# Patient Record
Sex: Female | Born: 1965 | Race: White | Hispanic: No | Marital: Married | Smoking: Never smoker
Health system: Southern US, Community
[De-identification: ages and names within clinical notes are randomized; demographics above are authoritative.]

## PROBLEM LIST (undated history)

## (undated) DIAGNOSIS — F329 Major depressive disorder, single episode, unspecified: Secondary | ICD-10-CM

## (undated) DIAGNOSIS — T753XXA Motion sickness, initial encounter: Secondary | ICD-10-CM

## (undated) DIAGNOSIS — J449 Chronic obstructive pulmonary disease, unspecified: Secondary | ICD-10-CM

## (undated) DIAGNOSIS — K219 Gastro-esophageal reflux disease without esophagitis: Secondary | ICD-10-CM

## (undated) DIAGNOSIS — F419 Anxiety disorder, unspecified: Secondary | ICD-10-CM

## (undated) DIAGNOSIS — N951 Menopausal and female climacteric states: Secondary | ICD-10-CM

## (undated) DIAGNOSIS — G039 Meningitis, unspecified: Secondary | ICD-10-CM

## (undated) DIAGNOSIS — J309 Allergic rhinitis, unspecified: Secondary | ICD-10-CM

## (undated) DIAGNOSIS — F32A Depression, unspecified: Secondary | ICD-10-CM

## (undated) DIAGNOSIS — Z780 Asymptomatic menopausal state: Secondary | ICD-10-CM

## (undated) DIAGNOSIS — R000 Tachycardia, unspecified: Secondary | ICD-10-CM

## (undated) DIAGNOSIS — J45909 Unspecified asthma, uncomplicated: Secondary | ICD-10-CM

## (undated) DIAGNOSIS — I340 Nonrheumatic mitral (valve) insufficiency: Secondary | ICD-10-CM

## (undated) DIAGNOSIS — Z9889 Other specified postprocedural states: Secondary | ICD-10-CM

## (undated) DIAGNOSIS — M8580 Other specified disorders of bone density and structure, unspecified site: Secondary | ICD-10-CM

## (undated) DIAGNOSIS — R5383 Other fatigue: Secondary | ICD-10-CM

## (undated) DIAGNOSIS — E039 Hypothyroidism, unspecified: Secondary | ICD-10-CM

## (undated) DIAGNOSIS — M5030 Other cervical disc degeneration, unspecified cervical region: Secondary | ICD-10-CM

## (undated) DIAGNOSIS — R42 Dizziness and giddiness: Secondary | ICD-10-CM

## (undated) DIAGNOSIS — R011 Cardiac murmur, unspecified: Secondary | ICD-10-CM

## (undated) DIAGNOSIS — J189 Pneumonia, unspecified organism: Secondary | ICD-10-CM

## (undated) DIAGNOSIS — R112 Nausea with vomiting, unspecified: Secondary | ICD-10-CM

## (undated) HISTORY — PX: ABDOMINAL HYSTERECTOMY: SHX81

## (undated) HISTORY — DX: Chronic obstructive pulmonary disease, unspecified: J44.9

## (undated) HISTORY — DX: Unspecified asthma, uncomplicated: J45.909

## (undated) HISTORY — DX: Anxiety disorder, unspecified: F41.9

## (undated) HISTORY — DX: Allergic rhinitis, unspecified: J30.9

## (undated) HISTORY — PX: SHOULDER SURGERY: SHX246

## (undated) HISTORY — PX: LEFT OOPHORECTOMY: SHX1961

## (undated) HISTORY — DX: Cardiac murmur, unspecified: R01.1

## (undated) HISTORY — DX: Menopausal and female climacteric states: N95.1

## (undated) HISTORY — DX: Other fatigue: R53.83

## (undated) HISTORY — PX: LAPAROSCOPY: SHX197

## (undated) HISTORY — DX: Major depressive disorder, single episode, unspecified: F32.9

## (undated) HISTORY — DX: Gastro-esophageal reflux disease without esophagitis: K21.9

## (undated) HISTORY — PX: CYSTECTOMY: SUR359

## (undated) HISTORY — DX: Other specified disorders of bone density and structure, unspecified site: M85.80

## (undated) HISTORY — PX: ARTHROSCOPIC REPAIR ACL: SUR80

## (undated) HISTORY — DX: Depression, unspecified: F32.A

## (undated) HISTORY — PX: KNEE SURGERY: SHX244

## (undated) HISTORY — DX: Other cervical disc degeneration, unspecified cervical region: M50.30

## (undated) HISTORY — DX: Meningitis, unspecified: G03.9

## (undated) HISTORY — DX: Tachycardia, unspecified: R00.0

## (undated) HISTORY — DX: Nonrheumatic mitral (valve) insufficiency: I34.0

## (undated) HISTORY — PX: DILATION AND CURETTAGE OF UTERUS: SHX78

## (undated) HISTORY — DX: Asymptomatic menopausal state: Z78.0

## (undated) HISTORY — DX: Pneumonia, unspecified organism: J18.9

---

## 2005-02-25 ENCOUNTER — Ambulatory Visit: Payer: Self-pay | Admitting: Orthopaedic Surgery

## 2005-03-29 ENCOUNTER — Ambulatory Visit: Payer: Self-pay | Admitting: Orthopaedic Surgery

## 2009-08-14 ENCOUNTER — Ambulatory Visit: Payer: Self-pay | Admitting: Obstetrics and Gynecology

## 2010-12-23 ENCOUNTER — Ambulatory Visit: Payer: Self-pay | Admitting: Obstetrics and Gynecology

## 2011-09-11 ENCOUNTER — Ambulatory Visit: Payer: Self-pay | Admitting: Internal Medicine

## 2011-09-15 ENCOUNTER — Ambulatory Visit: Payer: Self-pay | Admitting: Internal Medicine

## 2011-09-24 ENCOUNTER — Ambulatory Visit: Payer: Self-pay | Admitting: Internal Medicine

## 2012-01-13 ENCOUNTER — Ambulatory Visit: Payer: Self-pay | Admitting: Obstetrics and Gynecology

## 2013-01-11 LAB — HM PAP SMEAR: HM PAP: NEGATIVE

## 2013-03-26 ENCOUNTER — Ambulatory Visit: Payer: Self-pay | Admitting: Obstetrics and Gynecology

## 2013-08-24 ENCOUNTER — Encounter: Payer: Self-pay | Admitting: Internal Medicine

## 2013-08-24 LAB — PULMONARY FUNCTION TEST

## 2014-07-05 ENCOUNTER — Encounter: Payer: Self-pay | Admitting: Internal Medicine

## 2014-09-23 DIAGNOSIS — F329 Major depressive disorder, single episode, unspecified: Secondary | ICD-10-CM | POA: Insufficient documentation

## 2014-09-23 DIAGNOSIS — F419 Anxiety disorder, unspecified: Secondary | ICD-10-CM | POA: Insufficient documentation

## 2014-09-23 DIAGNOSIS — F32A Depression, unspecified: Secondary | ICD-10-CM | POA: Insufficient documentation

## 2014-09-23 DIAGNOSIS — E7800 Pure hypercholesterolemia, unspecified: Secondary | ICD-10-CM | POA: Insufficient documentation

## 2015-02-20 ENCOUNTER — Ambulatory Visit
Admit: 2015-02-20 | Disposition: A | Discharge status: Home / Self Care | Payer: Self-pay | Attending: Gastroenterology | Admitting: Gastroenterology

## 2015-12-18 ENCOUNTER — Encounter: Payer: Self-pay | Admitting: Internal Medicine

## 2015-12-18 LAB — PULMONARY FUNCTION TEST

## 2015-12-19 ENCOUNTER — Ambulatory Visit (INDEPENDENT_AMBULATORY_CARE_PROVIDER_SITE_OTHER): Payer: BLUE CROSS/BLUE SHIELD | Admitting: Cardiovascular Disease

## 2015-12-19 ENCOUNTER — Encounter: Payer: Self-pay | Admitting: Cardiovascular Disease

## 2015-12-19 VITALS — BP 120/78 | HR 86 | Ht 61.5 in | Wt 146.8 lb

## 2015-12-19 DIAGNOSIS — R0602 Shortness of breath: Secondary | ICD-10-CM | POA: Insufficient documentation

## 2015-12-19 DIAGNOSIS — R000 Tachycardia, unspecified: Secondary | ICD-10-CM

## 2015-12-19 DIAGNOSIS — R079 Chest pain, unspecified: Secondary | ICD-10-CM

## 2015-12-19 DIAGNOSIS — J4540 Moderate persistent asthma, uncomplicated: Secondary | ICD-10-CM

## 2015-12-19 DIAGNOSIS — J069 Acute upper respiratory infection, unspecified: Secondary | ICD-10-CM

## 2015-12-19 DIAGNOSIS — B9789 Other viral agents as the cause of diseases classified elsewhere: Secondary | ICD-10-CM

## 2015-12-19 MED ORDER — LEVALBUTEROL TARTRATE 45 MCG/ACT IN AERO: 1.0000 | INHALATION_SPRAY | RESPIRATORY_TRACT | Status: DC | PRN

## 2015-12-19 MED ORDER — DILTIAZEM HCL 30 MG PO TABS: 30.0000 mg | ORAL_TABLET | Freq: Three times a day (TID) | ORAL | Status: DC | PRN

## 2015-12-19 NOTE — Patient Instructions (Addendum)
You are doing well.  Xopenex as needed for rescue inhaler  Echocardiogram for shortness of breath, chest pain  Echocardiography is a painless test that uses sound waves to create images of your heart. It provides your doctor with information about the size and shape of your heart and how well your heart's chambers and valves are working. This procedure takes approximately one hour. There are no restrictions for this procedure.  Date & time: __________________________________  Monitor heart rate If you convert quickly to a tachycardia, Take diltiazem 30 mg 1 to 2 pills as needed  Please call us if you have new issues that need to be addressed before your next appt.     Echocardiogram An echocardiogram, or echocardiography, uses sound waves (ultrasound) to produce an image of your heart. The echocardiogram is simple, painless, obtained within a short period of time, and offers valuable information to your health care provider. The images from an echocardiogram can provide information such as:  Evidence of coronary artery disease (CAD).  Heart size.  Heart muscle function.  Heart valve function.  Aneurysm detection.  Evidence of a past heart attack.  Fluid buildup around the heart.  Heart muscle thickening.  Assess heart valve function. LET [facility] CARE PROVIDER KNOW ABOUT:  Any allergies you have.  All medicines you are taking, including vitamins, herbs, eye drops, creams, and over-the-counter medicines.  Previous problems you or members of your family have had with the use of anesthetics.  Any blood disorders you have.  Previous surgeries you have had.  Medical conditions you have.  Possibility of pregnancy, if this applies. BEFORE THE PROCEDURE  No special preparation is needed. Eat and drink normally.  PROCEDURE   In order to produce an image of your heart, gel will be applied to your chest and a wand-like tool (transducer) will be moved over your chest.  The gel will help transmit the sound waves from the transducer. The sound waves will harmlessly bounce off your heart to allow the heart images to be captured in real-time motion. These images will then be recorded.  You may need an IV to receive a medicine that improves the quality of the pictures. AFTER THE PROCEDURE You may return to your normal schedule including diet, activities, and medicines, unless your health care provider tells you otherwise.   This information is not intended to replace advice given to you by your health care provider. Make sure you discuss any questions you have with your health care provider.   Document Released: 10/29/2000 Document Revised: 11/22/2014 Document Reviewed: 07/09/2013 Elsevier Interactive Patient Education [employer].

## 2015-12-19 NOTE — Progress Notes (Addendum)
Patient ID: Cheyenne Nash, female    DOB: [birth year], 50 y.o.   MRN: [id number]  HPI Comments:  Cheyenne Nash is a pleasant 50 year old woman with history of asthma , followed by pulmonology in [place] , Dr [place], who presents by self-referral for symptoms of shortness of breath, tachycardia.  she reports that she does not have primary care physician at this time   She reports that in December she developed upper respiratory infection,  She had sore throat, cough , Gen. Malaise  She  conferenced with a [place]  And was given  Prednisone taper, Tessalon Perles.   She reports that symptoms persisted 5 days later, Tessalon Perles did not help her cough , she was " no better"  Again she continued to have coughing, shortness of breath, sore throat, reported that her chest hurt from the coughing.  She was given prednisone and Z-Pak with cough syrup with codeine.  She reports this helped her  Symptoms though cough recurred once or prednisone taper resolved.   She reports having had 4 prednisone tapers through this period.  Cough seems to improve on prednisone, but quickly recurs  After taper.   Currently feels somewhat better today but recently having significant tachycardia, lightheadedness, shortness of breath  [place] in [employer].  Went home , continued to have shortness of breath, tachycardia.  Reports it took some time for her symptoms to resolve.   Prior to  December 2016, she was well with no prior cardiac issues.  She denies any weight gain, abdominal swelling, lower extremity edema.  She has difficulty taking a deep breath secondary to cough.  Difficulty lying flat secondary to cough.  She has been monitoring her heart rate with a pulse oximeter,  Reports having heart rate up to 120 bpm on the day when she did appreciate tachycardia.  Unclear if she had a slow steady increase in her heart rate or if tachycardia was of acute onset.   EKG on today's visit shows no sinus rhythm  with rate 86 bpm, no significant ST or T-wave changes   Allergies  Allergen Reactions  . Ivp Dye [Iodinated Diagnostic Agents]   . Sulfa Antibiotics     Hives  Swelling       Medication List       This list is accurate as of: 12/19/15  5:54 PM.  Always use your most recent med list.               ALPRAZOLAM XR 1 MG 24 hr tablet  Generic drug:  ALPRAZolam  Take 1 mg by mouth daily.     azelastine 0.1 % nasal spray  Commonly known as:  ASTELIN  Place 2 sprays into the nose 2 (two) times daily.     azithromycin 500 MG tablet  Commonly known as:  ZITHROMAX  Take 500 mg by mouth. Monday, Wednesday and Friday.     cetirizine 10 MG tablet  Commonly known as:  ZYRTEC  Take 10 mg by mouth daily.     dexamethasone 4 MG tablet  Commonly known as:  DECADRON  4 mg. Take as directed.        doxycycline 100 MG capsule  Commonly known as:  VIBRAMYCIN  Take 100 mg by mouth daily.     DULoxetine 30 MG capsule  Commonly known as:  CYMBALTA  1 capsule qd for 2 weeks, then increase to 2 capsules once a day.     estradiol 1 MG  tablet  Commonly known as:  ESTRACE  TK 1 T PO D     fluticasone 50 MCG/ACT nasal spray  Commonly known as:  FLONASE  Place 2 sprays into the nose daily.        montelukast 10 MG tablet  Commonly known as:  SINGULAIR  Take 10 mg by mouth at bedtime.     omeprazole 40 MG capsule  Commonly known as:  PRILOSEC  Take 40 mg by mouth daily.     predniSONE 5 MG tablet  Commonly known as:  DELTASONE  6 po day one decrease by 5mg  qd til gone.     traZODone 100 MG tablet  Commonly known as:  DESYREL  TAKE 1 TABLET BY MOUTH EVERY NIGHT     valACYclovir 500 MG tablet  Commonly known as:  VALTREX  Take 500 mg by mouth daily as needed.     VENTOLIN HFA 108 (90 Base) MCG/ACT inhaler  Generic drug:  albuterol  Inhale 1-2 puffs into the lungs every 4 (four) hours as needed.     VOLTAREN 1 % Gel  Generic drug:  diclofenac sodium  Apply 2 g topically  as needed.         Past Medical History  Diagnosis Date  . GERD (gastroesophageal reflux disease)   . Degenerative disc disease, cervical   . Allergic rhinitis   . Anxiety and depression   . Hypercholesterolemia   . Osteopenia   . Menopausal syndrome   . COPD (chronic obstructive pulmonary disease) ([place])   . Asthma   . Heart murmur   . Tachycardia   . Mitral regurgitation     Past Surgical History  Procedure Laterality Date  . Abdominal hysterectomy    . Shoulder surgery      RIGHT  . Knee surgery      LEFT   . Arthroscopic repair acl      LEFT    Social History  reports that she has never smoked. She does not have any smokeless tobacco history on file. She reports that she does not drink alcohol or use illicit drugs.  Family History family history includes Heart attack in her mother; Heart disease (age of onset: 68) in her father; Hypertension in her father and mother.   Review of Systems  Constitutional: Negative.   Respiratory: Positive for cough and chest tightness.   Cardiovascular: Positive for palpitations.        tachycardia  Gastrointestinal: Negative.   Musculoskeletal: Negative.   Neurological: Negative.   Hematological: Negative.   Psychiatric/Behavioral: Negative.   All other systems reviewed and are negative.   BP 120/78 mmHg  Pulse 86  Ht 5' 1.5" (1.562 m)  Wt 146 lb 12 oz (66.565 kg)  BMI 27.28 kg/m2  Physical Exam  Constitutional: She is oriented to person, place, and time. She appears well-developed and well-nourished.  HENT:  Head: Normocephalic.  Nose: Nose normal.  Mouth/Throat: Oropharynx is clear and moist.  Eyes: Conjunctivae are normal. Pupils are equal, round, and reactive to light.  Neck: Normal range of motion. Neck supple. No JVD present.  Cardiovascular: Normal rate, regular rhythm, normal heart sounds and intact distal pulses.  Exam reveals no gallop and no friction rub.   No murmur heard. Pulmonary/Chest: Effort  normal. No respiratory distress. She has decreased breath sounds in the right lower field and the left lower field. She has no wheezes. She has no rales. She exhibits no tenderness.   Patient coughing with deep  inspiration  Abdominal: Soft. Bowel sounds are normal. She exhibits no distension. There is no tenderness.  Musculoskeletal: Normal range of motion. She exhibits no edema or tenderness.  Lymphadenopathy:    She has no cervical adenopathy.  Neurological: She is alert and oriented to person, place, and time. Coordination normal.  Skin: Skin is warm and dry. No rash noted. No erythema.  Psychiatric: She has a normal mood and affect. Her behavior is normal. Judgment and thought content normal.

## 2015-12-19 NOTE — Assessment & Plan Note (Signed)
No significant wheezing on today's visit  She has had several courses of prednisone  Symptoms suggestive of resolving viral URI, bronchospastic cough

## 2015-12-19 NOTE — Assessment & Plan Note (Signed)
Etiology of her shortness of breath likely multifactorial including resolving viral URI, asthma.  Unable to exclude structural heart disease, elevated right heart pressures, pleural effusion.  Echocardiogram pending, followed by a CT scan of the chest  To avoid tachycardia, suggested she try Xopenex

## 2015-12-19 NOTE — Assessment & Plan Note (Signed)
Atypical type chest pain coming on at nighttime when she lays down, sounds more pleuritic , even musculoskeletal.  May be exacerbated by coughing. Less likely pulmonary embolism given the chronic  insidious nature of her symptoms,  Associated with the viral URI symptoms  we will call her with the results of her echocardiogram  CT scan pending for 3 weeks from now back with pulmonology   Total encounter time more than 45 minutes  Greater than 50% was spent in counseling and coordination of care with the patient

## 2015-12-19 NOTE — Assessment & Plan Note (Signed)
Symptoms suggestive of sinus tachycardia brought on by exertion  Likely exacerbated by underlying lung disease, bronchospasm, asthma, resolving viral URI  Unable to exclude atrial tachycardia  As heart rate was around 120 bpm.  Recommended she monitor her heart rate using the pulse oximeter with exertion.  If she has acute onset of a tachycardia, suggested she try low-dose diltiazem 30 mg pill with repeat if needed.  Ideally would use short acting beta blocker such as propranolol but we will avoid this in the setting of asthma, possible bronchospasm.  We have offered today or even 30 day monitor if she has recurrent symptoms

## 2015-12-19 NOTE — Assessment & Plan Note (Signed)
Patient had sore throat,  Laryngitis,general malaise, cough at the start of her illness.  No real response to antibiotics  seems to have improved with prednisone, still with residual cough  Echocardiogram has been ordered to rule out structural heart disease, estimate right heart pressures.  Unable to exclude pleural effusions as she is unable to take a deep breath.  She reports CT scan has been scheduled for 3 weeks from now with pulmonology in [place].

## 2015-12-25 ENCOUNTER — Other Ambulatory Visit: Payer: Self-pay

## 2015-12-25 ENCOUNTER — Telehealth: Payer: Self-pay | Admitting: Cardiovascular Disease

## 2015-12-25 ENCOUNTER — Ambulatory Visit
Admission: RE | Admit: 2015-12-25 | Discharge: 2015-12-25 | Disposition: A | Discharge status: Home / Self Care | Payer: BLUE CROSS/BLUE SHIELD | Source: Ambulatory Visit | Attending: Cardiovascular Disease | Admitting: Cardiovascular Disease

## 2015-12-25 ENCOUNTER — Ambulatory Visit (INDEPENDENT_AMBULATORY_CARE_PROVIDER_SITE_OTHER): Payer: BLUE CROSS/BLUE SHIELD

## 2015-12-25 DIAGNOSIS — R079 Chest pain, unspecified: Secondary | ICD-10-CM | POA: Diagnosis not present

## 2015-12-25 DIAGNOSIS — R0602 Shortness of breath: Secondary | ICD-10-CM | POA: Insufficient documentation

## 2015-12-25 NOTE — Telephone Encounter (Signed)
Pt in office this am for ECHO, c/o SOB. Per Dr. Rockey Situ, recommended pt proceed w/ CT that was suggested by her pulmonologist in [place]. She states that she was advised against proceeding w/ this at her last ov, as "the pictures wouldn't be very good." Advised her that Dr. Rockey Situ would like to get a chest x-ray on her today before adjusting meds, as pt's sx are most likely r/t her lungs. Offered to schedule her w/ pulm here, but she declines "until this infection is under control". Dr. Rockey Situ is in clinic and has not read ECHO yet. She states that Dr. Delilah Shan in [place] told her that she "is going downhill fast" and he suggested she be out of work until March 1. She asks if we can provide a letter clearing her to be out of work, advised her to contact Dr. Delilah Shan for this.  Pt proceeded to the [facility] for cxr, but came back and stated that she "just had one". We do not have any x-rays in [facility] or [facility] (last one I can see was in 2005). Dr. Rockey Situ is in clinic w/ a new pt at the moment, will await his recommendation.

## 2015-12-25 NOTE — Telephone Encounter (Signed)
Spoke w/ pt.  She states that she went ahead and had the chest x-ray before hearing back from [place]. Advised her that Dr. Rockey Situ reviewed her preliminary ECHO results and it is normal.   He recommends that she proceed w/ chest CT that was ordered by Dr. Delilah Shan and that she should est care w/ pulmonary here so that she will have one here in [place], as Dr. Delilah Shan is treating her from [place]. She became quite upset and stated that Dr. Rockey Situ told her that she did not need to leave Dr. Delilah Shan and now we are telling her otherwise.  Advised her that she does not need to leave him, but her prelim ECHO results do not indicate that her sx are heart related, so it would benefit her to have someone here to communicate w/ pulmonologist.  She states"it's up to Dr. Rockey Situ, but he never told me who he wanted me to see".   She states that she has already left the premises and does not want to come back over to make appt.  Advised her that I can transfer her to scheduling to set this up, she does not need to be present. She raised her voice several times during conversation, stating that I am giving her conflicting information. She became frustrated and handed the phone to her son in law.   Advised him of Dr. Donivan Scull recommendation and he is agreeable to scheduling appt w/ pulmonary.  Call transferred to front desk to set this up.

## 2015-12-25 NOTE — Telephone Encounter (Signed)
Patient is waiting [facility] in [facility] on reply to needing another xray today.  [place] waiting on Dr. Donivan Scull response.  Patient says she just had a xray  Ordered by [place] last week.  Please call patient when with response.

## 2015-12-26 ENCOUNTER — Telehealth: Payer: Self-pay | Admitting: *Deleted

## 2015-12-26 NOTE — Telephone Encounter (Signed)
Results routed to Dr. Delilah Shan.

## 2015-12-26 NOTE — Telephone Encounter (Signed)
Patient called and wants results of echo and chest xray sent to Dr. Delilah Shan with [facility] tele# [phone] or [phone] sent stat! Thanks

## 2015-12-30 ENCOUNTER — Ambulatory Visit: Payer: Self-pay | Admitting: Cardiovascular Disease

## 2015-12-31 ENCOUNTER — Institutional Professional Consult (permissible substitution): Payer: Self-pay | Admitting: Internal Medicine

## 2016-01-09 ENCOUNTER — Encounter: Payer: Self-pay | Admitting: Internal Medicine

## 2016-01-15 ENCOUNTER — Ambulatory Visit (INDEPENDENT_AMBULATORY_CARE_PROVIDER_SITE_OTHER): Payer: BLUE CROSS/BLUE SHIELD | Admitting: Obstetrics and Gynecology

## 2016-01-15 ENCOUNTER — Encounter: Payer: Self-pay | Admitting: Obstetrics and Gynecology

## 2016-01-15 VITALS — BP 115/74 | HR 79 | Temp 98.4°F | Resp 16 | Ht 62.0 in | Wt 144.0 lb

## 2016-01-15 DIAGNOSIS — J45901 Unspecified asthma with (acute) exacerbation: Secondary | ICD-10-CM

## 2016-01-15 DIAGNOSIS — N649 Disorder of breast, unspecified: Secondary | ICD-10-CM

## 2016-01-19 ENCOUNTER — Institutional Professional Consult (permissible substitution): Payer: BLUE CROSS/BLUE SHIELD | Admitting: Pulmonary Disease

## 2016-01-19 DIAGNOSIS — R51 Headache: Secondary | ICD-10-CM

## 2016-01-19 DIAGNOSIS — J309 Allergic rhinitis, unspecified: Secondary | ICD-10-CM | POA: Insufficient documentation

## 2016-01-19 DIAGNOSIS — N951 Menopausal and female climacteric states: Secondary | ICD-10-CM | POA: Insufficient documentation

## 2016-01-19 DIAGNOSIS — K219 Gastro-esophageal reflux disease without esophagitis: Secondary | ICD-10-CM | POA: Insufficient documentation

## 2016-01-19 DIAGNOSIS — J45909 Unspecified asthma, uncomplicated: Secondary | ICD-10-CM | POA: Insufficient documentation

## 2016-01-19 DIAGNOSIS — M8580 Other specified disorders of bone density and structure, unspecified site: Secondary | ICD-10-CM | POA: Insufficient documentation

## 2016-01-19 DIAGNOSIS — R519 Headache, unspecified: Secondary | ICD-10-CM | POA: Insufficient documentation

## 2016-01-19 DIAGNOSIS — M5030 Other cervical disc degeneration, unspecified cervical region: Secondary | ICD-10-CM | POA: Insufficient documentation

## 2016-01-19 NOTE — Patient Instructions (Signed)
1.  Follow up with Dr. Doy Hutching regarding internal medicine follow-up. 2.  Pulmonary consultation with [place]. 3.  Obtain MRI results of breast lesion.  Once obtained, we will follow-up with further workup. 4.  Return as scheduled for routine physical

## 2016-01-19 NOTE — Progress Notes (Signed)
Chief complaint: 1.  Breast lesion on MRI. 2.  Pulmonary issues.  The patient presents for follow-up on above issues.  She had seen her pulmonologist in [facility] for acute exacerbation of chronic bronchitis/pulmonary issues.  Previously she had discontinuation of medications which were prescribed for chronic management of her airway disease.  She had a flare of symptomatology, and did not improved with local treatments.  Subsequent, she returned to her pulmonologist who reinstituted.  Medications and improved her pulmonary function.  Part of her work up included a chest MRI that revealed a breast lesion which needs follow-up.  The reports or scans are unavailable at this time.  Past medical history, past surgical history, problems, medications, and allergies are reviewed.  OBJECTIVE: BP 115/74 mmHg  Pulse 79  Temp(Src) 98.4 F (36.9 C) (Oral)  Resp 16  Ht 5\' 2"  (1.575 m)  Wt 144 lb (65.318 kg)  BMI 26.33 kg/m2 Physical exam-deferred.  ASSESSMENT: 1. Reactive airway disease, recent decompensation, now corrected. 2.  MRI breast lesion.  PLAN: 1.  Recommend patient to continue with follow-up with Dr. Henriette Combs, internal medicine. 2.  Referral to [facility] pulmonology for management of chronic reactive airway disease. 3.  Obtain MRI results.  We will proceed after review with follow-up management as indicated by her CT scan findings.  A total of 15 minutes were spent face-to-face with the patient during this encounter and over half of that time dealt with counseling and coordination of care.  Brayton Mars, MD  Note: This dictation was prepared with Dragon dictation along with smaller phrase technology. Any transcriptional errors that result from this process are unintentional.

## 2016-01-20 ENCOUNTER — Telehealth: Payer: Self-pay | Admitting: Pulmonary Disease

## 2016-01-20 NOTE — Telephone Encounter (Signed)
Called and spoke with the pt. Pt is requesting a consult with [facility]. Pt is scheduled for an ov with BQ on 03/05/16. Pt voiced understanding and had no further questions.

## 2016-01-27 ENCOUNTER — Encounter: Payer: Self-pay | Admitting: Obstetrics and Gynecology

## 2016-01-27 ENCOUNTER — Ambulatory Visit (INDEPENDENT_AMBULATORY_CARE_PROVIDER_SITE_OTHER): Payer: BLUE CROSS/BLUE SHIELD | Admitting: Obstetrics and Gynecology

## 2016-01-27 VITALS — BP 90/68 | HR 102 | Ht 62.0 in | Wt 146.3 lb

## 2016-01-27 DIAGNOSIS — J45901 Unspecified asthma with (acute) exacerbation: Secondary | ICD-10-CM | POA: Diagnosis not present

## 2016-01-27 DIAGNOSIS — Z780 Asymptomatic menopausal state: Secondary | ICD-10-CM

## 2016-01-27 DIAGNOSIS — Z0000 Encounter for general adult medical examination without abnormal findings: Secondary | ICD-10-CM | POA: Diagnosis not present

## 2016-01-27 DIAGNOSIS — Z01419 Encounter for gynecological examination (general) (routine) without abnormal findings: Secondary | ICD-10-CM

## 2016-01-27 DIAGNOSIS — Z1239 Encounter for other screening for malignant neoplasm of breast: Secondary | ICD-10-CM

## 2016-01-27 MED ORDER — ESTRADIOL 1 MG PO TABS: 1.0000 mg | ORAL_TABLET | Freq: Every day | ORAL | Status: DC

## 2016-01-27 NOTE — Patient Instructions (Addendum)
1. No Pap smear Needed. 2.  Mammogram 3.  Follow-up after mammogram for review of CT scan and referral to general surgery 4.  Return to Dr. Doy Hutching for immediate management regarding uncontrolled chronic reactive airway disease 5.  Estradiol refilled 6.  Return in 1 year

## 2016-01-27 NOTE — Progress Notes (Signed)
Patient ID: Cheyenne Nash, female   DOB: 11/27/1965, 50 y.o.   MRN: [id number] ANNUAL PREVENTATIVE CARE GYN  ENCOUNTER NOTE  Subjective:       Cheyenne Nash is a 50 y.o. [id number] female here for a routine annual gynecologic exam.  Current complaints: 1. Left breast spot seen on CT 12/2015 2. Exacerbation of reactive airway disease: Scheduled with [facility] pulmonology 03/05/16   Gynecologic History No LMP recorded. Patient has had a hysterectomy. Contraception: status post hysterectomy Last Pap: no further paps needed. Results were: normal Last mammogram: 2014. Results were: normal Last colonoscopy: 08/2015. Results were: normal Menopause onset at 47 HRT: estradiol 1mg  daily for 1 year. No vasomotor symptoms No urinary incontinence or bowel symptoms     Obstetric History OB History  Gravida Para Term Preterm AB SAB TAB Ectopic Multiple Living  2 2 2       2     # Outcome Date GA Lbr Len/2nd Weight Sex Delivery Anes PTL Lv  2 Term    4 lb 2.4 oz (1.882 kg) F Vag-Spont   Y  1 Term    5 lb 2.2 oz (2.331 kg) F Vag-Spont   Y      Past Medical History  Diagnosis Date  . GERD (gastroesophageal reflux disease)   . Degenerative disc disease, cervical   . Allergic rhinitis   . Anxiety and depression   . Hypercholesterolemia   . Osteopenia   . Menopausal syndrome   . COPD (chronic obstructive pulmonary disease) ([place])   . Asthma   . Heart murmur   . Tachycardia   . Mitral regurgitation   . Meningitis     h/o recd some cns and chemo infusion  . Menopause   . Fatigue   . Lung infection     Past Surgical History  Procedure Laterality Date  . Shoulder surgery      RIGHT  . Knee surgery      LEFT   . Arthroscopic repair acl      LEFT  . Abdominal hysterectomy      [place] lso  . Left oophorectomy    . Laparoscopy      lysis of adhesion  . Dilation and curettage of uterus    . Cystectomy Right     Current Outpatient Prescriptions on File Prior to Visit  Medication Sig  Dispense Refill  . ALPRAZolam (ALPRAZOLAM XR) 1 MG 24 hr tablet Take 1 mg by mouth daily.     Marland Kitchen azelastine (ASTELIN) 0.1 % nasal spray Place 2 sprays into the nose 2 (two) times daily.     Marland Kitchen BREO ELLIPTA 100-25 MCG/INH AEPB   10  . cetirizine (ZYRTEC) 10 MG tablet Take 10 mg by mouth daily.     Marland Kitchen dexamethasone (DECADRON) 4 MG tablet 4 mg. Take as directed.  0  . DULoxetine (CYMBALTA) 30 MG capsule 1 capsule qd for 2 weeks, then increase to 2 capsules once a day.    . estradiol (ESTRACE) 1 MG tablet TK 1 T PO D    . fluticasone (FLONASE) 50 MCG/ACT nasal spray Place 2 sprays into the nose daily.     Marland Kitchen levalbuterol (XOPENEX HFA) 45 MCG/ACT inhaler Inhale 1 puff into the lungs every 4 (four) hours as needed for wheezing. 1 Inhaler 12  . montelukast (SINGULAIR) 10 MG tablet Take 10 mg by mouth at bedtime.   10  . SPIRIVA RESPIMAT 1.25 MCG/ACT AERS   5  . traZODone (DESYREL)  100 MG tablet Reported on 01/27/2016    . valACYclovir (VALTREX) 500 MG tablet Take 500 mg by mouth daily as needed.     . VENTOLIN HFA 108 (90 Base) MCG/ACT inhaler Inhale 1-2 puffs into the lungs every 4 (four) hours as needed.   10   No current facility-administered medications on file prior to visit.    Allergies  Allergen Reactions  . Sulfa Antibiotics     Hives  Swelling     Social History   Social History  . Marital Status: Married    Spouse Name: N/A  . Number of Children: N/A  . Years of Education: N/A   Occupational History  . Not on file.   Social History Main Topics  . Smoking status: Never Smoker   . Smokeless tobacco: Not on file  . Alcohol Use: No  . Drug Use: No  . Sexual Activity: Yes    Birth Control/ Protection: Surgical   Other Topics Concern  . Not on file   Social History Narrative    Family History  Problem Relation Age of Onset  . Heart attack Mother   . Hypertension Mother   . Hypertension Father   . Heart disease Father 54    CABG  . Depression Maternal Grandmother    . Cancer Neg Hx     The following portions of the patient's history were reviewed and updated as appropriate: allergies, current medications, past family history, past medical history, past social history, past surgical history and problem list.  Review of Systems ROS Review of Systems - General ROS: negative for - chills,  fever, hot flashes, night sweats, weight gain or weight loss.POSITIVE-fatigue, Psychological ROS: negative for - anxiety, decreased libido, depression, mood swings, physical abuse or sexual abuse Ophthalmic ROS: negative for - blurry vision, eye pain or loss of vision ENT ROS: negative for - headaches, hearing change, visual changes or vocal changes Allergy and Immunology ROS: negative for - hives, itchy/watery eyes or seasonal allergies Hematological and Lymphatic ROS: negative for - bleeding problems, bruising, swollen lymph nodes or weight loss Endocrine ROS: negative for - galactorrhea, hair pattern changes, hot flashes, malaise/lethargy, mood swings, palpitations, polydipsia/polyuria, skin changes, temperature intolerance or unexpected weight changes Breast ROS: negative for - new or changing breast lumps or nipple discharge Respiratory ROS: positive for - cough, negative for - shortness of breath Cardiovascular ROS: negative for - chest pain, irregular heartbeat, palpitations or shortness of breath Gastrointestinal ROS: no abdominal pain, change in bowel habits, or black or bloody stools Genito-Urinary ROS: no dysuria, trouble voiding, or hematuria Musculoskeletal ROS: negative for - joint pain or joint stiffness Neurological ROS: negative for - bowel and bladder control changes Dermatological ROS: negative for rash and skin lesion changes   Objective:   BP 90/68 mmHg  Pulse 102  Ht 5\' 2"  (1.575 m)  Wt 146 lb 4.8 oz (66.361 kg)  BMI 26.75 kg/m2 CONSTITUTIONAL: Well-developed, well-nourished female in no acute distress.  PSYCHIATRIC: Normal mood and affect.  Normal behavior. Normal judgment and thought content. [place]: Alert and oriented to person, place, and time. Normal muscle tone coordination. No cranial nerve deficit noted. HENT:  Normocephalic, atraumatic, External right and left ear normal. Oropharynx is clear and moist EYES: Conjunctivae and EOM are normal. Pupils are equal, round, and reactive to light. No scleral icterus.  NECK: Normal range of motion, supple, no masses.  Normal thyroid.  SKIN: Skin is warm and dry. No rash noted. Not diaphoretic. No erythema.  No pallor. CARDIOVASCULAR: Normal heart rate noted, regular rhythm, no murmur. RESPIRATORY: Limited due to coughing paroxysms; uncontrolled BREASTS: Fibrocystic changes. Symmetric in size. No masses, skin changes, nipple drainage, or lymphadenopathy. ABDOMEN: Soft, normal bowel sounds, no distention noted.  No tenderness, rebound or guarding.  BLADDER: Normal PELVIC:  External Genitalia: Normal  BUS: Normal  Vagina: Mild Atrophy  Cervix: surgically absent  Uterus: Surgically Absent   Adnexa: Nonpalpable non tender  RV: External Exam NormaI, No Rectal Masses and Normal Sphincter tone  MUSCULOSKELETAL: Normal range of motion. No tenderness.  No cyanosis, clubbing, or edema.  2+ distal pulses. LYMPHATIC: No Axillary, Supraclavicular, or Inguinal Adenopathy.    Assessment:   Annual gynecologic examination 50 y.o. Contraception: status post hysterectomy [id number] Chronic pulmonary reactive airway disease, poorly controlled; recent need for multiple steroid Dosepak's   Plan:  Pap: Not needed Mammogram: Ordered Stool Guaiac Testing:  08/2015- wnl Labs: thru pcp dr sparks Routine preventative health maintenance measures emphasized: reactive airway disease Return to see Dr. Doy Hutching for acute management of pulmonary disease exacerbation Following mammography, we'll refer to general surgery for assessment of breast abnormality noted on CT scan Return to Clinic - [place], [place]  Luz Lex PA-S Brayton Mars, MD   I have seen, interviewed, and examined the patient in conjunction with the [facility].A. student and affirm the diagnosis and management plan. Ansar Skoda A. Newel Oien, MD, FACOG   Note: This dictation was prepared with Dragon dictation along with smaller phrase technology. Any transcriptional errors that result from this process are unintentional.

## 2016-01-28 ENCOUNTER — Ambulatory Visit: Payer: Self-pay | Admitting: Cardiovascular Disease

## 2016-01-28 ENCOUNTER — Telehealth: Payer: Self-pay | Admitting: Pulmonary Disease

## 2016-01-28 NOTE — Telephone Encounter (Signed)
Called and spoke with pt's friend Otila Kluver. I explained to Otila Kluver that she not on the pt's DPR and that I would not be able to discuss any information about the patient with her. She voiced understanding and had no further questions.  Called and spoke with the pt. Informed her that TIna is not on her DPR and that I am unable to discuss her information with Otila Kluver. Pt states that she went to her obgyn on 01/27/16 and her obgyn informed her that she had fluid on her lungs and needed to be seen by a pulmonary doctor ASAP. She states that she has been off of prednisone for 7 days and has increased SOB. She states that she had a consult appointment with BQ on 03/05/16 but that her obgyn scheduled her with Dr. Stevenson Clinch on 02/04/16 for a consult appointment. She is requesting that the appointment be with BQ instead but she needs to be seen sooner then 03/05/16. Pt states that she would go to ER if it meant that she would be seen sooner by BQ. I explained to her that  I would discuss the situation with [place]. She voiced understanding and had no further questions.  Spoke with Caryl Pina and made her aware of the above. BQ has no availability until 03/05/16  Called and spoke with pt. Informed her to keep ov with Dr. Stevenson Clinch and after seeing him we can keep the ov with BQ so that she can establish with BQ. She voiced understanding and had no further questions. Nothing further needed at this time.

## 2016-02-04 ENCOUNTER — Encounter: Payer: Self-pay | Admitting: Internal Medicine

## 2016-02-04 ENCOUNTER — Ambulatory Visit (INDEPENDENT_AMBULATORY_CARE_PROVIDER_SITE_OTHER): Payer: BLUE CROSS/BLUE SHIELD | Admitting: Internal Medicine

## 2016-02-04 ENCOUNTER — Other Ambulatory Visit
Admission: RE | Admit: 2016-02-04 | Discharge: 2016-02-04 | Disposition: A | Discharge status: Home / Self Care | Payer: BLUE CROSS/BLUE SHIELD | Source: Ambulatory Visit | Attending: Internal Medicine | Admitting: Internal Medicine

## 2016-02-04 VITALS — BP 128/70 | HR 101 | Ht 61.5 in | Wt 149.4 lb

## 2016-02-04 DIAGNOSIS — J4540 Moderate persistent asthma, uncomplicated: Secondary | ICD-10-CM

## 2016-02-04 DIAGNOSIS — R0602 Shortness of breath: Secondary | ICD-10-CM | POA: Insufficient documentation

## 2016-02-04 LAB — COMPREHENSIVE METABOLIC PANEL
ALK PHOS: 85 U/L (ref 38–126)
ALT: 19 U/L (ref 14–54)
AST: 21 U/L (ref 15–41)
Albumin: 3.5 g/dL (ref 3.5–5.0)
Anion gap: 5 (ref 5–15)
BUN: 10 mg/dL (ref 6–20)
CALCIUM: 9.1 mg/dL (ref 8.9–10.3)
CHLORIDE: 105 mmol/L (ref 101–111)
CO2: 28 mmol/L (ref 22–32)
CREATININE: 0.59 mg/dL (ref 0.44–1.00)
Glucose, Bld: 96 mg/dL (ref 65–99)
Potassium: 4.1 mmol/L (ref 3.5–5.1)
Sodium: 138 mmol/L (ref 135–145)
Total Bilirubin: 0.5 mg/dL (ref 0.3–1.2)
Total Protein: 6.5 g/dL (ref 6.5–8.1)

## 2016-02-04 LAB — CBC WITH DIFFERENTIAL/PLATELET
BASOS ABS: 0.1 10*3/uL (ref 0–0.1)
Basophils Relative: 1 %
Eosinophils Absolute: 0.1 10*3/uL (ref 0–0.7)
Eosinophils Relative: 1 %
HCT: 36.7 % (ref 35.0–47.0)
HEMOGLOBIN: 11.9 g/dL — AB (ref 12.0–16.0)
LYMPHS ABS: 1.3 10*3/uL (ref 1.0–3.6)
Lymphocytes Relative: 19 %
MCH: 26.7 pg (ref 26.0–34.0)
MCHC: 32.4 g/dL (ref 32.0–36.0)
MCV: 82.4 fL (ref 80.0–100.0)
Monocytes Absolute: 0.3 10*3/uL (ref 0.2–0.9)
Monocytes Relative: 4 %
NEUTROS ABS: 5.3 10*3/uL (ref 1.4–6.5)
Neutrophils Relative %: 75 %
Platelets: 303 10*3/uL (ref 150–440)
RBC: 4.46 MIL/uL (ref 3.80–5.20)
RDW: 15.5 % — ABNORMAL HIGH (ref 11.5–14.5)
WBC: 7.1 10*3/uL (ref 3.6–11.0)

## 2016-02-04 MED ORDER — FLUTICASONE-SALMETEROL 500-50 MCG/DOSE IN AEPB: 1.0000 | INHALATION_SPRAY | Freq: Two times a day (BID) | RESPIRATORY_TRACT | Status: DC

## 2016-02-04 NOTE — Assessment & Plan Note (Signed)
Multifactorial: Asthma, restrictive lung disease, deconditioning, recent infection.  I have reviewed her pulmonary function testing from her previous pulmonologist, last PFT February 2017. Showed mild restriction with FEV1 of 72%, FEV1/FVC 84%, TLC 78, RV 102, DLCO 54%. Given the decrease in DLCO might be some restriction at needs further workup. She did have a CT scan of her chest and was told it was completely normal without very pulmonologist. Given the decreased DLCO and the reduction in TLC, will further evaluate with high-resolution CAT scan.  Plan: -Continue with inhalers as described in plan for asthma -High-resolution CAT scan -Restrictive lung disease labs: CBC, CMP, rheumatoid factor, ANA, hypersensitivity panel, ESR, Ace level

## 2016-02-04 NOTE — Assessment & Plan Note (Signed)
Moderate persistent asthma since December 2016. Patient with 7 rounds of antibiotics, followed by 7 rounds of steroids. Did have symptomatic relief after steroid use which will be temporarily only lasting about one week. Previously well controlled with Advair 500/50, and as needed rescue inhaler. These meds were stopped by primary care physician in May 2016. Over the last 3 months she has been on Breo, Spiriva Respimat, and 3 times a week azithromycin for anti-inflammatory purposes. She is suffering nausea and stomach cramping from days azithromycin and has stopped that over the last 3 weeks. I do not believe that she is COPD, believe her recent viral URI have triggered her bronchospasms and asthma, there may be an underlying ILD or restrictive defect given her recent pulmonary function testing that needs further workup with a high-resolution CAT scan. Being that she was well controlled with Advair and Spiriva capsules in the past, will resume these 2 medications.  Plan: -Advair 500/50, 1 puff twice a day, gargle and rinse after each use -Spiriva capsule, 1 capsule daily, gargle and rinse as each use -Continue with allergy regiment -Continue with rescue inhaler as instructed.

## 2016-02-04 NOTE — Patient Instructions (Signed)
Follow up with Dr. Stevenson Clinch in: 1 months - HRCT w/o contrast - Labs: CBC w/diff, CMP, ESR, HSP Panel, ANA, RF, ACE level - stop Breo and Spiriva Respimat and Azithromycin - Start Advair 500/50 - 1 puff bid, -gargle and rinse after each use.  - Continue with Spiriva Capsule - 1 capsule daily - we will obtain a copy of your most recent CT Chest scan and last office visit note from your previous pulmonologist (Dr. Delilah Shan).

## 2016-02-04 NOTE — Progress Notes (Signed)
[facility] Consultation    Date: 02/04/2016  MRN# [id number] Cheyenne Nash 1966-04-17  Referring Physician: Dr. Fulton Reek PMD - Dr Doy Hutching.  Cheyenne Nash is a 50 y.o. old female seen in consultation for worsening sob, difficult to control ashtma  CC:  Chief Complaint  Patient presents with  . pulmonary consult    pt ref by dr. Elta Guadeloupe defrancesco for fluid on lungs. pt c/o sob, heart racing, fatigued, dry cough, wheezing, chest pain/tightness X64mo    HPI:  Patient is a pleasant 50year old female past medical history of asthma, seasonal allergies, seen in consultation for worsening asthma symptoms, cough, fatigue, and shortness of breath. Patient previously followed with Dr. DDelilah Shanfor the past 18 years, [place] [facility] States that since December she has been having worsening cough, intense fatigue, bronchospasms and difficult to control asthma which shortness of breath and dyspnea on exertion worse with climbing stairs or any type of incline. During the last 3 months she's had about 7 rounds of antibiotics followed by steroids. She states that she would get better almost back to baseline with steroids, however, after about a week of being off of steroids his symptoms did return. She has seen cardiology, she had an echocardiogram, essentially normal,. She has been seen by her OB/GYN for her annual visit, at that time they heard crackles and 1 her to be evaluated by pulmonologist. She has seen her primary pulmonologist in January and in February, she had full PFTs done at that time that showed mild restriction and moderate decrease in DLCO, she was CT scan without contrast per the patient, was told everything was okay except for a possible spot in her breast. She followed up with OB/GYN for the abnormal spot in her breasts, then told that it was benign. She noticed symptoms of cough fatigue and shortness of breath was  mild starting in September, her CBC at that time was completely normal and showed no signs of anemia. From September to December 2016 her symptoms gradually worsened, and became more worse once her grandkids visited her and they had a URI. Over the last 2 months she was restarted on Breo, Spiriva Respimat, azithromycin 3 times per week. This was all done by her primary pulmonologist. Today her major complaints are intense fatigue, cough, shortness of breath with exertion. She states that she couldn't walk to her car from a house. 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 Given her decline in her respiratory status she has been in and out of work for the last couple months, and stated that she has noticed mild worsening of symptoms upon arrival to work. She has a dog at home which is hypoallergenic according to the patient Usually her asthma is worse in the fall in early spring, and it is controlled well with allergy regiment of Astelin, Zyrtec, Singulair. For her fatigue she's had a full workup with ACTH, TSH labs, and follow-up with endocrine, was told all her labs were essentially normal. Patient is a never smoker.   PMHX:   Past Medical History  Diagnosis Date  . GERD (gastroesophageal reflux disease)   . Degenerative disc disease, cervical   . Allergic rhinitis   . Anxiety and depression   . Osteopenia   . Menopausal syndrome   . COPD (chronic obstructive  pulmonary disease) ([place])   . Asthma   . Heart murmur   . Tachycardia   . Mitral regurgitation   . Meningitis     h/o recd some cns and chemo infusion  . Menopause   . Fatigue   . Lung infection    Surgical Hx:  Past Surgical History  Procedure Laterality Date  . Shoulder surgery      RIGHT  . Knee surgery      LEFT   . Arthroscopic repair acl      LEFT  . Abdominal hysterectomy      [place]  .  Left oophorectomy    . Laparoscopy      lysis of adhesion  . Dilation and curettage of uterus    . Cystectomy Right    Family Hx:  Family History  Problem Relation Age of Onset  . Hypertension Mother   . Hypertension Father   . Heart disease Father 62    CABG  . Heart attack Father   . Depression Maternal Grandmother   . Cancer Neg Hx    Social Hx:   Social History  Substance Use Topics  . Smoking status: Never Smoker   . Smokeless tobacco: None  . Alcohol Use: No   Medication:   Current Outpatient Rx  Name  Route  Sig  Dispense  Refill  . ALPRAZolam (ALPRAZOLAM XR) 1 MG 24 hr tablet   Oral   Take 1 mg by mouth daily.          Marland Kitchen azelastine (ASTELIN) 0.1 % nasal spray   Nasal   Place 2 sprays into the nose 2 (two) times daily.          Marland Kitchen BREO ELLIPTA 100-25 MCG/INH AEPB            10     Dispense as written.   . cetirizine (ZYRTEC) 10 MG tablet   Oral   Take 10 mg by mouth daily.          Marland Kitchen dexamethasone (DECADRON) 4 MG tablet      4 mg. Take as directed.      0   . dexlansoprazole (DEXILANT) 60 MG capsule   Oral   Take by mouth.         . DULoxetine (CYMBALTA) 30 MG capsule      1 capsule qd for 2 weeks, then increase to 2 capsules once a day.         . estradiol (ESTRACE) 1 MG tablet      TK 1 T PO D         . fluticasone (FLONASE) 50 MCG/ACT nasal spray   Nasal   Place 2 sprays into the nose daily.          Marland Kitchen levalbuterol (XOPENEX HFA) 45 MCG/ACT inhaler   Inhalation   Inhale 1 puff into the lungs every 4 (four) hours as needed for wheezing.   1 Inhaler   12   . montelukast (SINGULAIR) 10 MG tablet   Oral   Take 10 mg by mouth at bedtime.       10   . SPIRIVA RESPIMAT 1.25 MCG/ACT AERS            5     Dispense as written.   . traZODone (DESYREL) 100 MG tablet      Reported on 01/27/2016         . valACYclovir (VALTREX) 500 MG tablet   Oral  Take 500 mg by mouth daily as needed.          . VENTOLIN  HFA 108 (90 Base) MCG/ACT inhaler   Inhalation   Inhale 1-2 puffs into the lungs every 4 (four) hours as needed.       10     Dispense as written.   Marland Kitchen azithromycin (ZITHROMAX) 500 MG tablet      Reported on 02/04/2016      3   . Fluticasone-Salmeterol (ADVAIR DISKUS) 500-50 MCG/DOSE AEPB   Inhalation   Inhale 1 puff into the lungs 2 (two) times daily.   60 each   5       Allergies:  Sulfa antibiotics  Review of Systems  Constitutional: Positive for malaise/fatigue. Negative for fever, chills and weight loss.  HENT: Negative for congestion, ear discharge, hearing loss, nosebleeds and tinnitus.   Eyes: Negative for blurred vision, double vision, photophobia and pain.  Respiratory: Positive for cough, shortness of breath and wheezing. Negative for sputum production.   Cardiovascular: Negative for chest pain, palpitations, orthopnea, claudication and leg swelling.  Gastrointestinal: Positive for nausea. Negative for heartburn, vomiting and diarrhea.  Musculoskeletal: Negative for myalgias.  Skin: Negative for itching and rash.  Neurological: Positive for weakness. Negative for dizziness, tingling and headaches.  Endo/Heme/Allergies: Does not bruise/bleed easily.  Psychiatric/Behavioral: Negative for depression.     Physical Examination:   VS: BP 128/70 mmHg  Pulse 101  Ht 5' 1.5" (1.562 m)  Wt 149 lb 6.4 oz (67.767 kg)  BMI 27.78 kg/m2  SpO2 97%  General Appearance: No distress  Neuro:without focal findings, mental status, speech normal, alert and oriented, cranial nerves 2-12 intact, reflexes normal and symmetric, sensation grossly normal  HEENT: PERRLA, EOM intact, no ptosis, no other lesions noticed; Mallampati  Pulmonary: normal breath sounds., Good respiratory effort, shallow breath sounds, no wheezing, no crackles, no rales;   Sputum Production: None  CardiovascularNormal S1,S2.  No m/r/g.  Abdominal aorta pulsation normal.    Abdomen: Benign, Soft, non-tender,  No masses, hepatosplenomegaly, No lymphadenopathy Renal:  No costovertebral tenderness  GU:  No performed at this time. Endoc: No evident thyromegaly, no signs of acromegaly or Cushing features Skin:   warm, no rashes, no ecchymosis  Extremities: normal, no cyanosis, clubbing, no edema, warm with normal capillary refill. Other findings: None     Rad results: (The following images and results were reviewed by Dr. Stevenson Clinch on 02/04/2016). 12/25/2015 EXAM: CHEST 2 VIEW  COMPARISON: Report from 04/01/2015 chest radiograph (images not available).  FINDINGS: Normal heart size. Normal mediastinal contour. No pneumothorax. No pleural effusion. Lungs appear clear, with no acute consolidative airspace disease and no pulmonary edema. The lungs are not hyperinflated.  IMPRESSION: No active cardiopulmonary disease.  ECHO 12/25/2015 LV EF: 60% - 65%  ------------------------------------------------------------------- History: PMH: Chest pain. Tachycardia, fatigue, dyspnea, and murmur. Chronic obstructive pulmonary disease.  ------------------------------------------------------------------- Study Conclusions  - Left ventricle: The cavity size was normal. Wall thickness was  normal. Systolic function was normal. The estimated ejection fraction was in the range of 60% to 65%. Wall motion was normal; there were no regional wall motion abnormalities. Left ventricular diastolic function parameters were normal. - Mitral valve: There was mild regurgitation.  Pulmonary function test February 2017 reviewed by Dr. Stevenson Clinch FEV1/FVC 84% FEV1 72% TLC 70% FVC 66% RV 102% DLCO 54% Mild to moderate flattening of the inspiratory curve Interpretation-no significant obstruction mild restriction with moderate decrease in DLCO    Assessment and Plan: 50 year old  female with past medical history of moderate persistent asthma previously well-controlled, now with worsening cough, fatigue,  shortness of breath, for the past 3 months, difficult to control asthma. SOB (shortness of breath) Multifactorial: Asthma, restrictive lung disease, deconditioning, recent infection.  I have reviewed her pulmonary function testing from her previous pulmonologist, last PFT February 2017. Showed mild restriction with FEV1 of 72%, FEV1/FVC 84%, TLC 78, RV 102, DLCO 54%. Given the decrease in DLCO might be some restriction at needs further workup. She did have a CT scan of her chest and was told it was completely normal without very pulmonologist. Given the decreased DLCO and the reduction in TLC, will further evaluate with high-resolution CAT scan.  Plan: -Continue with inhalers as described in plan for asthma -High-resolution CAT scan -Restrictive lung disease labs: CBC, CMP, rheumatoid factor, ANA, hypersensitivity panel, ESR, Ace level  Asthma, moderate persistent Moderate persistent asthma since December 2016. Patient with 7 rounds of antibiotics, followed by 7 rounds of steroids. Did have symptomatic relief after steroid use which will be temporarily only lasting about one week. Previously well controlled with Advair 500/50, and as needed rescue inhaler. These meds were stopped by primary care physician in May 2016. Over the last 3 months she has been on Breo, Spiriva Respimat, and 3 times a week azithromycin for anti-inflammatory purposes. She is suffering nausea and stomach cramping from days azithromycin and has stopped that over the last 3 weeks. I do not believe that she is COPD, believe her recent viral URI have triggered her bronchospasms and asthma, there may be an underlying ILD or restrictive defect given her recent pulmonary function testing that needs further workup with a high-resolution CAT scan. Being that she was well controlled with Advair and Spiriva capsules in the past, will resume these 2 medications.  Plan: -Advair 500/50, 1 puff twice a day, gargle and rinse after each  use -Spiriva capsule, 1 capsule daily, gargle and rinse as each use -Continue with allergy regiment -Continue with rescue inhaler as instructed.    Updated Medication List Outpatient Encounter Prescriptions as of 02/04/2016  Medication Sig  . ALPRAZolam (ALPRAZOLAM XR) 1 MG 24 hr tablet Take 1 mg by mouth daily.   Marland Kitchen azelastine (ASTELIN) 0.1 % nasal spray Place 2 sprays into the nose 2 (two) times daily.   Marland Kitchen BREO ELLIPTA 100-25 MCG/INH AEPB   . cetirizine (ZYRTEC) 10 MG tablet Take 10 mg by mouth daily.   Marland Kitchen dexamethasone (DECADRON) 4 MG tablet 4 mg. Take as directed.  Marland Kitchen dexlansoprazole (DEXILANT) 60 MG capsule Take by mouth.  . DULoxetine (CYMBALTA) 30 MG capsule 1 capsule qd for 2 weeks, then increase to 2 capsules once a day.  . estradiol (ESTRACE) 1 MG tablet TK 1 T PO D  . fluticasone (FLONASE) 50 MCG/ACT nasal spray Place 2 sprays into the nose daily.   Marland Kitchen levalbuterol (XOPENEX HFA) 45 MCG/ACT inhaler Inhale 1 puff into the lungs every 4 (four) hours as needed for wheezing.  . montelukast (SINGULAIR) 10 MG tablet Take 10 mg by mouth at bedtime.   Marland Kitchen SPIRIVA RESPIMAT 1.25 MCG/ACT AERS   . traZODone (DESYREL) 100 MG tablet Reported on 01/27/2016  . valACYclovir (VALTREX) 500 MG tablet Take 500 mg by mouth daily as needed.   . VENTOLIN HFA 108 (90 Base) MCG/ACT inhaler Inhale 1-2 puffs into the lungs every 4 (four) hours as needed.   Marland Kitchen azithromycin (ZITHROMAX) 500 MG tablet Reported on 02/04/2016  . Fluticasone-Salmeterol (ADVAIR DISKUS) 500-50  MCG/DOSE AEPB Inhale 1 puff into the lungs 2 (two) times daily.  . [DISCONTINUED] estradiol (ESTRACE) 1 MG tablet Take 1 tablet (1 mg total) by mouth daily. (Patient not taking: Reported on 02/04/2016)   No facility-administered encounter medications on file as of 02/04/2016.    Orders for this visit: Orders Placed This Encounter  Procedures  . CT Chest High Resolution    Standing Status: Future     Number of Occurrences:      Standing  Expiration Date: 04/05/2017    Order Specific Question:  Reason for Exam (SYMPTOM  OR DIAGNOSIS REQUIRED)    Answer:  SOB    Order Specific Question:  Is the patient pregnant?    Answer:  No    Order Specific Question:  Preferred imaging location?    Answer:  [facility] PANEL WITH GFR  . ANA  . Rheumatoid Factor  . Acetaminophen level  . Hypersensitivity Pneumonitis  . CBC w/Diff     Thank  you for the consultation and for allowing [facility] to assist in the care of your patient. Our recommendations are noted above.  Please contact us if we can be of further service.   Vilinda Boehringer, MD [facility] Office Number: [phone]  Note: This note was prepared with Dragon dictation along with smaller phrase technology. Any transcriptional errors that result from this process are unintentional.

## 2016-02-05 LAB — ANTINUCLEAR ANTIBODIES, IFA: ANTINUCLEAR ANTIBODIES, IFA: POSITIVE — AB

## 2016-02-05 LAB — FANA STAINING PATTERNS: Speckled Pattern: 1:80 {titer}

## 2016-02-05 LAB — ACETAMINOPHEN LEVEL

## 2016-02-09 LAB — HYPERSENSITIVITY PNEUMONITIS
A. PULLULANS ABS: NEGATIVE
A.Fumigatus #1 Abs: NEGATIVE
Micropolyspora faeni, IgG: NEGATIVE
Pigeon Serum Abs: NEGATIVE
THERMOACT. SACCHARII: NEGATIVE
Thermoactinomyces vulgaris, IgG: NEGATIVE

## 2016-02-09 LAB — RHEUMATOID FACTOR: Rhuematoid fact SerPl-aCnc: 10.5 IU/mL (ref 0.0–13.9)

## 2016-02-12 ENCOUNTER — Institutional Professional Consult (permissible substitution): Payer: Self-pay | Admitting: Pulmonary Disease

## 2016-02-17 ENCOUNTER — Other Ambulatory Visit: Payer: Self-pay

## 2016-02-17 MED ORDER — ESTRADIOL 1 MG PO TABS: 1.0000 mg | ORAL_TABLET | Freq: Every day | ORAL | Status: DC

## 2016-02-18 ENCOUNTER — Telehealth: Payer: Self-pay | Admitting: Cardiovascular Disease

## 2016-02-18 ENCOUNTER — Emergency Department: Payer: BLUE CROSS/BLUE SHIELD

## 2016-02-18 ENCOUNTER — Emergency Department
Admission: EM | Admit: 2016-02-18 | Discharge: 2016-02-18 | Disposition: A | Discharge status: Home / Self Care | Payer: BLUE CROSS/BLUE SHIELD | Attending: Emergency Medicine | Admitting: Emergency Medicine

## 2016-02-18 ENCOUNTER — Encounter: Payer: Self-pay | Admitting: Medical Oncology

## 2016-02-18 DIAGNOSIS — R0789 Other chest pain: Secondary | ICD-10-CM | POA: Diagnosis not present

## 2016-02-18 DIAGNOSIS — J45909 Unspecified asthma, uncomplicated: Secondary | ICD-10-CM | POA: Insufficient documentation

## 2016-02-18 DIAGNOSIS — J449 Chronic obstructive pulmonary disease, unspecified: Secondary | ICD-10-CM | POA: Diagnosis not present

## 2016-02-18 DIAGNOSIS — R0602 Shortness of breath: Secondary | ICD-10-CM | POA: Insufficient documentation

## 2016-02-18 DIAGNOSIS — R079 Chest pain, unspecified: Secondary | ICD-10-CM

## 2016-02-18 LAB — CBC
HEMATOCRIT: 36.3 % (ref 35.0–47.0)
HEMOGLOBIN: 12 g/dL (ref 12.0–16.0)
MCH: 27.3 pg (ref 26.0–34.0)
MCHC: 33.1 g/dL (ref 32.0–36.0)
MCV: 82.3 fL (ref 80.0–100.0)
Platelets: 285 10*3/uL (ref 150–440)
RBC: 4.41 MIL/uL (ref 3.80–5.20)
RDW: 15.7 % — ABNORMAL HIGH (ref 11.5–14.5)
WBC: 6.8 10*3/uL (ref 3.6–11.0)

## 2016-02-18 LAB — BASIC METABOLIC PANEL
ANION GAP: 6 (ref 5–15)
BUN: 9 mg/dL (ref 6–20)
CALCIUM: 8.9 mg/dL (ref 8.9–10.3)
CO2: 25 mmol/L (ref 22–32)
Chloride: 103 mmol/L (ref 101–111)
Creatinine, Ser: 0.7 mg/dL (ref 0.44–1.00)
GLUCOSE: 104 mg/dL — AB (ref 65–99)
POTASSIUM: 3.6 mmol/L (ref 3.5–5.1)
SODIUM: 134 mmol/L — AB (ref 135–145)

## 2016-02-18 LAB — TROPONIN I

## 2016-02-18 MED ORDER — ASPIRIN EC 81 MG PO TBEC: 81.0000 mg | DELAYED_RELEASE_TABLET | Freq: Every day | ORAL | Status: DC

## 2016-02-18 NOTE — Discharge Instructions (Signed)
Please seek medical attention for any high fevers, chest pain, shortness of breath, change in behavior, persistent vomiting, bloody stool or any other new or concerning symptoms. ° ° °Nonspecific Chest Pain °It is often hard to find the cause of chest pain. There is always a chance that your pain could be related to something serious, such as a heart attack or a blood clot in your lungs. Chest pain can also be caused by conditions that are not life-threatening. If you have chest pain, it is very important to follow up with your doctor. ° °HOME CARE °· If you were prescribed an antibiotic medicine, finish it all even if you start to feel better. °· Avoid any activities that cause chest pain. °· Do not use any tobacco products, including cigarettes, chewing tobacco, or electronic cigarettes. If you need help quitting, ask your doctor. °· Do not drink alcohol. °· Take medicines only as told by your doctor. °· Keep all follow-up visits as told by your doctor. This is important. This includes any further testing if your chest pain does not go away. °· Your doctor may tell you to keep your head raised (elevated) while you sleep. °· Make lifestyle changes as told by your doctor. These may include: °¨ Getting regular exercise. Ask your doctor to suggest some activities that are safe for you. °¨ Eating a heart-healthy diet. Your doctor or a diet specialist (dietitian) can help you to learn healthy eating options. °¨ Maintaining a healthy weight. °¨ Managing diabetes, if necessary. °¨ Reducing stress. °GET HELP IF: °· Your chest pain does not go away, even after treatment. °· You have a rash with blisters on your chest. °· You have a fever. °GET HELP RIGHT AWAY IF: °· Your chest pain is worse. °· You have an increasing cough, or you cough up blood. °· You have severe belly (abdominal) pain. °· You feel extremely weak. °· You pass out (faint). °· You have chills. °· You have sudden, unexplained chest discomfort. °· You have  sudden, unexplained discomfort in your arms, back, neck, or jaw. °· You have shortness of breath at any time. °· You suddenly start to sweat, or your skin gets clammy. °· You feel nauseous. °· You vomit. °· You suddenly feel light-headed or dizzy. °· Your heart begins to beat quickly, or it feels like it is skipping beats. °These symptoms may be an emergency. Do not wait to see if the symptoms will go away. Get medical help right away. Call your local emergency services (911 in the [place] Do not drive yourself to the hospital. °  °This information is not intended to replace advice given to you by your health care provider. Make sure you discuss any questions you have with your health care provider. °  °Document Released: 04/19/2008 Document Revised: 11/22/2014 Document Reviewed: 06/07/2014 °Elsevier Interactive Patient Education ©2016 [employer] ° °

## 2016-02-18 NOTE — ED Notes (Signed)
Pt reports that she has had intermittent chest pain and today it got worse. States that she walked to her car today and had chest pain and SOB that was worse, accompanied by nausea. She called her PCP office and was told to come to the ED. Pt reports that she was able to walk as much as she wanted to in December and now is having trouble even with short distances. Pt has asthma and COPD. She has seen Dr. Karl Bales, who did echo. She thought results were normal, but she was told by nurse in his office today when she called that there might be something questionable on the echo. Pt alert & oriented with periodic coughing.

## 2016-02-18 NOTE — ED Provider Notes (Signed)
[facility] Emergency Department Provider Note   ____________________________________________  Time seen: ~1830  I have reviewed the triage vital signs and the nursing notes.   HISTORY  Chief Complaint Chest Pain and Shortness of Breath   History limited by: Not Limited   HPI SARAHA Nash is a 50 y.o. female who presents to the emergency department today because of concerns for chest pain and shortness breath. The patient states that she started developing chest tightness today. She states he got worse when she was walking around. The pain did radiate up into her neck. She described it as a tightness. It was compared by some shortness of breath. She states that she has been having similar pain on and off for the past few months. Today however was worse. She denies any recent fevers.     Past Medical History  Diagnosis Date  . GERD (gastroesophageal reflux disease)   . Degenerative disc disease, cervical   . Allergic rhinitis   . Anxiety and depression   . Osteopenia   . Menopausal syndrome   . COPD (chronic obstructive pulmonary disease) ([place])   . Asthma   . Heart murmur   . Tachycardia   . Mitral regurgitation   . Meningitis     h/o recd some cns and chemo infusion  . Menopause   . Fatigue   . Lung infection     Patient Active Problem List   Diagnosis Date Noted  . Allergic rhinitis 01/19/2016  . Asthma without status asthmaticus 01/19/2016  . Degeneration of intervertebral disc of cervical region 01/19/2016  . Acid reflux 01/19/2016  . Cephalalgia 01/19/2016  . Climacteric 01/19/2016  . Osteopenia 01/19/2016  . Pain in the chest 12/19/2015  . SOB (shortness of breath) 12/19/2015  . Tachycardia 12/19/2015  . Asthma, moderate persistent 12/19/2015  . Viral URI with cough 12/19/2015  . Anxiety and depression 09/23/2014  . Pure hypercholesterolemia 09/23/2014    Past Surgical History  Procedure Laterality Date  . Shoulder surgery       RIGHT  . Knee surgery      LEFT   . Arthroscopic repair acl      LEFT  . Abdominal hysterectomy      [place] lso  . Left oophorectomy    . Laparoscopy      lysis of adhesion  . Dilation and curettage of uterus    . Cystectomy Right     Current Outpatient Rx  Name  Route  Sig  Dispense  Refill  . ALPRAZolam (ALPRAZOLAM XR) 1 MG 24 hr tablet   Oral   Take 1 mg by mouth daily.          Marland Kitchen azelastine (ASTELIN) 0.1 % nasal spray   Nasal   Place 2 sprays into the nose 2 (two) times daily.          Marland Kitchen azithromycin (ZITHROMAX) 500 MG tablet      Reported on 02/04/2016      3   . BREO ELLIPTA 100-25 MCG/INH AEPB            10     Dispense as written.   . cetirizine (ZYRTEC) 10 MG tablet   Oral   Take 10 mg by mouth daily.          Marland Kitchen dexamethasone (DECADRON) 4 MG tablet      4 mg. Take as directed.      0   . dexlansoprazole (DEXILANT) 60 MG capsule  Oral   Take by mouth.         . DULoxetine (CYMBALTA) 30 MG capsule      1 capsule qd for 2 weeks, then increase to 2 capsules once a day.         . estradiol (ESTRACE) 1 MG tablet   Oral   Take 1 tablet (1 mg total) by mouth daily.   90 tablet   4   . fluticasone (FLONASE) 50 MCG/ACT nasal spray   Nasal   Place 2 sprays into the nose daily.          . Fluticasone-Salmeterol (ADVAIR DISKUS) 500-50 MCG/DOSE AEPB   Inhalation   Inhale 1 puff into the lungs 2 (two) times daily.   60 each   5   . levalbuterol (XOPENEX HFA) 45 MCG/ACT inhaler   Inhalation   Inhale 1 puff into the lungs every 4 (four) hours as needed for wheezing.   1 Inhaler   12   . montelukast (SINGULAIR) 10 MG tablet   Oral   Take 10 mg by mouth at bedtime.       10   . SPIRIVA RESPIMAT 1.25 MCG/ACT AERS            5     Dispense as written.   . traZODone (DESYREL) 100 MG tablet      Reported on 01/27/2016         . valACYclovir (VALTREX) 500 MG tablet   Oral   Take 500 mg by mouth daily as needed.           . VENTOLIN HFA 108 (90 Base) MCG/ACT inhaler   Inhalation   Inhale 1-2 puffs into the lungs every 4 (four) hours as needed.       10     Dispense as written.     Allergies Sulfa antibiotics  Family History  Problem Relation Age of Onset  . Hypertension Mother   . Hypertension Father   . Heart disease Father 29    CABG  . Heart attack Father   . Depression Maternal Grandmother   . Cancer Neg Hx     Social History Social History  Substance Use Topics  . Smoking status: Never Smoker   . Smokeless tobacco: None  . Alcohol Use: No    Review of Systems  Constitutional: Negative for fever. Cardiovascular: Positive for chest pain. Respiratory: Positive for shortness of breath. Gastrointestinal: Negative for abdominal pain, vomiting and diarrhea. Genitourinary: Negative for dysuria. Musculoskeletal: Negative for back pain. Skin: Negative for rash. Neurological: Negative for headaches, focal weakness or numbness.   10-point ROS otherwise negative.  ____________________________________________   PHYSICAL EXAM:  VITAL SIGNS: ED Triage Vitals  Enc Vitals Group     BP 02/18/16 1624 128/73 mmHg     Pulse Rate 02/18/16 1624 88     Resp 02/18/16 1624 18     Temp 02/18/16 1624 98 F (36.7 C)     Temp Source 02/18/16 1624 Oral     SpO2 02/18/16 1624 98 %     Weight 02/18/16 1624 145 lb (65.772 kg)     Height 02/18/16 1624 5\' 2"  (1.575 m)     Head Cir --      Peak Flow --      Pain Score 02/18/16 1624 2   Constitutional: Alert and oriented. Well appearing and in no distress. Eyes: Conjunctivae are normal. PERRL. Normal extraocular movements. ENT   Head: Normocephalic and atraumatic.  Nose: No congestion/rhinnorhea.   Mouth/Throat: Mucous membranes are moist.   Neck: No stridor. Hematological/Lymphatic/Immunilogical: No cervical lymphadenopathy. Cardiovascular: Normal rate, regular rhythm.  No murmurs, rubs, or gallops. Respiratory: Normal  respiratory effort without tachypnea nor retractions. Breath sounds are clear and equal bilaterally. No wheezes/rales/rhonchi. Gastrointestinal: Soft and nontender. No distention.  Genitourinary: Deferred Musculoskeletal: Normal range of motion in all extremities. No joint effusions.  No lower extremity tenderness nor edema. Neurologic:  Normal speech and language. No gross focal neurologic deficits are appreciated.  Skin:  Skin is warm, dry and intact. No rash noted. Psychiatric: Mood and affect are normal. Speech and behavior are normal. Patient exhibits appropriate insight and judgment.  ____________________________________________    LABS (pertinent positives/negatives)  Labs Reviewed  BASIC METABOLIC PANEL - Abnormal; Notable for the following:    Sodium 134 (*)    Glucose, Bld 104 (*)    All other components within normal limits  CBC - Abnormal; Notable for the following:    RDW 15.7 (*)    All other components within normal limits  TROPONIN I     ____________________________________________   EKG  I, Nance Pear, attending physician, personally viewed and interpreted this EKG  EKG Time: 1620 Rate: 79 Rhythm: normal sinus rhythm Axis: normal Intervals: qtc 435 QRS: narrow ST changes: no st elevation Impression: normal ekg  No significant change compared to EKG dated February 3rd, 2017 ____________________________________________    RADIOLOGY  CXR IMPRESSION: No active disease.  ____________________________________________   PROCEDURES  Procedure(s) performed: None  Critical Care performed: No  ____________________________________________   INITIAL IMPRESSION / ASSESSMENT AND PLAN / ED COURSE  Pertinent labs & imaging results that were available during my care of the patient were reviewed by me and considered in my medical decision making (see chart for details).  Patient presented to the emergency department today because of concerns for  chest pain and shortness breath. EKG without any concerning findings. Troponin negative 2. I did discuss with on-call cardiologist for Dr. Rockey Situ who recommended sending the message to [facility]. This was done. I also encouraged patient to contact Dr. Rockey Situ as well. Additionally recommended to patient that she start aspirin 81mg  daily.  ____________________________________________   FINAL CLINICAL IMPRESSION(S) / ED DIAGNOSES  Final diagnoses:  Chest pain, unspecified chest pain type     Nance Pear, MD 02/18/16 2158

## 2016-02-18 NOTE — ED Notes (Signed)
Patient discharged to home per MD order. Patient in stable condition, and deemed medically cleared by ED provider for discharge. Discharge instructions reviewed with patient/family using "Teach Back"; verbalized understanding of medication education and administration, and information about follow-up care. Denies further concerns. ° °

## 2016-02-18 NOTE — Telephone Encounter (Signed)
Patient called in and stated that she had crushing intense chest pain with nausea and states that she has just not been feeling well. She was currently driving into town and wanted to know what she should do. Instructed her to go to the nearest emergency room to be evaluated because of her symptoms. She verbalized understanding and had no further questions at this time.

## 2016-02-18 NOTE — Telephone Encounter (Signed)
Patient c/o Palpitations:  High priority if patient c/o lightheadedness and shortness of breath.  1. How long have you been having palpitations? Since end of January   2. Are you currently experiencing lightheadedness and shortness of breath?  Yes   3. Have you checked your BP and heart rate? (document readings) no  4. Are you experiencing any other symptoms?  Patient says the racing has improved but now she is sob chest pain and feels like she may vomit or pass out

## 2016-02-18 NOTE — ED Notes (Signed)
Pt reports she has been having episodes of intermittent chest tightness since jan, pt reports pain has increased and worsens with any exertion. Pt also reports sob with any exertion. Pt reports headache today.

## 2016-02-19 ENCOUNTER — Telehealth: Payer: Self-pay

## 2016-02-19 DIAGNOSIS — R079 Chest pain, unspecified: Secondary | ICD-10-CM

## 2016-02-19 NOTE — Telephone Encounter (Signed)
-----   Message from Minna Merritts, MD sent at 02/19/2016  2:15 PM EDT ----- Can we call the patient for follow-up, If she is having episodes of tachycardia contributing to her symptoms, would consider a 30 day monitor  If she is having only chest pain, would consider a stress test.  We could schedule treadmill Myoview If needed, can schedule office visit. Openings next week thx [employer]  ----- Message -----    From: Nance Pear, MD    Sent: 02/18/2016   9:23 PM      To: Minna Merritts, MD  Dr. Rockey Situ, I saw Ms. Andrew in the emergency department today because of an episode of chest pain with exertion. Her EKG was unconcerned and she had two negative troponins. I was hoping however that she could follow up with you in the next couple of days and perhaps get provacative testing given my concern for worsening angina. Thank you so much. Carter Kitten

## 2016-02-19 NOTE — Telephone Encounter (Signed)
Spoke w/ pt.  She reports that she is at work today, despite her family's protests.  She states that as long as she is sitting at her desk, she feels fine. However, when she is walking, she c/o chest tightness, SOB and nausea.   Sx last approx 30 mins after resting. Spoke w/ Dr. Rockey Situ who advises that pt proceed w/ ETT Myoview, may switch to Lexi if she is unable to treadmill. She is agreeable. Pt sched for ETT Myoview Mon 4/10 @ 7:30. Reviewed instructions, pt verbalizes understanding.

## 2016-02-23 ENCOUNTER — Encounter
Admission: RE | Admit: 2016-02-23 | Discharge: 2016-02-23 | Disposition: A | Discharge status: Home / Self Care | Payer: BLUE CROSS/BLUE SHIELD | Source: Ambulatory Visit | Attending: Cardiovascular Disease | Admitting: Cardiovascular Disease

## 2016-02-23 ENCOUNTER — Ambulatory Visit: Payer: BLUE CROSS/BLUE SHIELD | Admitting: Cardiovascular Disease

## 2016-02-23 DIAGNOSIS — R079 Chest pain, unspecified: Secondary | ICD-10-CM | POA: Diagnosis not present

## 2016-02-23 LAB — NM MYOCAR MULTI W/SPECT W/WALL MOTION / EF
CHL CUP MPHR: 169 {beats}/min
CHL CUP RESTING HR STRESS: 75 {beats}/min
CSEPED: 6 min
CSEPEDS: 51 s
CSEPEW: 9.2 METS
LV sys vol: 16 mL
LVDIAVOL: 52 mL (ref 46–106)
Peak HR: 151 {beats}/min
Percent HR: 89 %
SDS: 0
SRS: 2
SSS: 2
TID: 0.77

## 2016-02-23 MED ORDER — TECHNETIUM TC 99M SESTAMIBI - CARDIOLITE
30.3900 | Freq: Once | INTRAVENOUS | Status: AC | PRN
  Administered 2016-02-23: 30.39 via INTRAVENOUS

## 2016-02-23 MED ORDER — TECHNETIUM TC 99M SESTAMIBI GENERIC - CARDIOLITE
13.8970 | Freq: Once | INTRAVENOUS | Status: AC | PRN
  Administered 2016-02-23: 13.897 via INTRAVENOUS

## 2016-03-05 ENCOUNTER — Institutional Professional Consult (permissible substitution): Payer: BLUE CROSS/BLUE SHIELD | Admitting: Pulmonary Disease

## 2016-03-10 ENCOUNTER — Ambulatory Visit
Admission: RE | Admit: 2016-03-10 | Discharge: 2016-03-10 | Disposition: A | Discharge status: Home / Self Care | Payer: BLUE CROSS/BLUE SHIELD | Source: Ambulatory Visit | Attending: Internal Medicine | Admitting: Internal Medicine

## 2016-03-10 DIAGNOSIS — N200 Calculus of kidney: Secondary | ICD-10-CM | POA: Insufficient documentation

## 2016-03-10 DIAGNOSIS — J9811 Atelectasis: Secondary | ICD-10-CM | POA: Insufficient documentation

## 2016-03-10 DIAGNOSIS — R0602 Shortness of breath: Secondary | ICD-10-CM | POA: Diagnosis not present

## 2016-03-11 ENCOUNTER — Telehealth: Payer: Self-pay | Admitting: *Deleted

## 2016-03-11 NOTE — Telephone Encounter (Signed)
Pt informed of results. Nothing further needed. 

## 2016-03-11 NOTE — Telephone Encounter (Signed)
-----   Message from Vilinda Boehringer, MD sent at 03/10/2016  3:09 PM EDT ----- Regarding: CT results Please inform patient that her CT scan was reviewed, no findings of Interstitial Lung Disease, no masses/tumors.   Thank you -VM

## 2016-03-16 ENCOUNTER — Encounter: Payer: Self-pay | Admitting: Internal Medicine

## 2016-03-16 ENCOUNTER — Ambulatory Visit (INDEPENDENT_AMBULATORY_CARE_PROVIDER_SITE_OTHER): Payer: BLUE CROSS/BLUE SHIELD | Admitting: Internal Medicine

## 2016-03-16 ENCOUNTER — Ambulatory Visit: Payer: BLUE CROSS/BLUE SHIELD | Admitting: Internal Medicine

## 2016-03-16 VITALS — BP 116/82 | HR 98 | Ht 61.0 in | Wt 143.0 lb

## 2016-03-16 DIAGNOSIS — R0602 Shortness of breath: Secondary | ICD-10-CM | POA: Diagnosis not present

## 2016-03-16 DIAGNOSIS — J4540 Moderate persistent asthma, uncomplicated: Secondary | ICD-10-CM

## 2016-03-16 NOTE — Assessment & Plan Note (Signed)
Moderate persistent asthma since December 2016. Patient with 7 rounds of antibiotics, followed by 7 rounds of steroids. Did have symptomatic relief after steroid use which will be temporarily only lasting about one week. Previously well controlled with Advair 500/50, and as needed rescue inhaler. These meds were stopped by primary care physician in May 2016. Over the last 3 months she has been on Breo, Spiriva Respimat, and 3 times a week azithromycin for anti-inflammatory purposes. She is suffering nausea and stomach cramping from days azithromycin and has stopped that over the last 3 weeks. I do not believe that she has COPD, believe her recent viral URI have triggered her bronchospasms and asthma.  Recent high-resolution CAT scan did not show any significant findings for interstitial lung disease. Overall, with no specific etiology that could be triggering chest tightness along with intermittent shortness of breath. Cardiology has ruled out any significant valvular disorders with an echocardiogram or any significant coronary artery disease with a nuclear medicine stress test. Other rare instances that could be causing this could be paroxysmal arrhythmias or even vocal cord dysfunction. I have asked patient to follow-up with her ENT to have a vocal cords evaluated. We will also plan for cardiopulmonary stress test.   Plan: -Advair 500/50, 1 puff twice a day, gargle and rinse after each use -Spiriva capsule, 1 capsule daily, gargle and rinse as each use -Continue with allergy regiment -cardiopulmonary stress testing ENT evaluation of vocal cords -Continue with rescue inhaler as instructed.

## 2016-03-16 NOTE — Patient Instructions (Signed)
Follow up with Dr. Stevenson Clinch in:2 months - we will schedule a cardiopulmonary exercise stress test - cont with current inhalers - avoid allergens - exercise as tolerated - Inquire from your ENT Doctor about looking at your vocal cords for possible Vocal Cord Dysfunction.

## 2016-03-16 NOTE — Assessment & Plan Note (Signed)
Multifactorial: Asthma, restrictive lung disease, deconditioning, recent infection.  I have reviewed her pulmonary function testing from her previous pulmonologist, last PFT February 2017. Showed mild restriction with FEV1 of 72%, FEV1/FVC 84%, TLC 78, RV 102, DLCO 54%. Given the decrease in DLCO might be some restriction at needs further workup. He has a mild decrease in DLCO in TLC. Follow-up with high-resolution CAT scan did not show any significant findings of interstitial lung disease. She did have a ANA done which was positive, that showed a homogeneous/speckle pattern, however this was done in the setting of acute illness. We'll plan for repeat after cardiopulmonary stress testing (if this is negative).   Plan: -Continue with inhalers as described in plan for asthma -cardiopulmonary stress test prior to follow-up visit

## 2016-03-16 NOTE — Progress Notes (Signed)
[facility] Consultation      MRN# [id number] Cheyenne Nash [birth year]   CC: Chief Complaint  Patient presents with  . Follow-up    SOB w/exertion; gets HA and sick on stomach; no cough; chest tightness/pain;      Brief History: 50 year old female past medical history of asthma,allergies, being followed by pulmonary for recurrent shortness of breath, chest palpitations, asthma exacerbations in the setting of normal cardiac workup and essentially normal pulmonary function tests along with normal CT imaging.   Events since last clinic visit: Patient here for follow-up visit of her shortness of breath and chest palpitations. Since her last visit she has only been using Advair which was directed to her. She states that she was recent seen in the ED for chest tightness, shortness of breath, lightheadedness, she had a nuclear medicine stress test in which is completely normal, she also had cardiac workup done in the form of troponins, which were also normal. At her last visit a high-resolution CAT scan was ordered, today that was reviewed with the patient, essentially the lung parenchyma is completely normal with no significant findings of interstitial lung disease. She did have an ANA titer done that showed it was positive with a homogeneous and speckle pattern, however this was done in the setting of recent acute illness. Overall today patient states that her breathing is very erratic, there ties her she can walk 5 miles without any shortness of breath, but even walking up a slight incline may induce chest tightness tachycardia and palpitations.    Medication:   Current Outpatient Rx  Name  Route  Sig  Dispense  Refill  . ALPRAZolam (ALPRAZOLAM XR) 1 MG 24 hr tablet   Oral   Take 1 mg by mouth daily.          Marland Kitchen aspirin EC 81 MG tablet   Oral   Take 1 tablet (81 mg total) by mouth daily.   30 tablet   2   . azelastine (ASTELIN) 0.1 % nasal spray   Nasal  Place 2 sprays into the nose 2 (two) times daily.          Marland Kitchen azithromycin (ZITHROMAX) 500 MG tablet      Reported on 02/04/2016      3   . BREO ELLIPTA 100-25 MCG/INH AEPB            10     Dispense as written.   . cetirizine (ZYRTEC) 10 MG tablet   Oral   Take 10 mg by mouth daily.          Marland Kitchen dexamethasone (DECADRON) 4 MG tablet      4 mg. Take as directed.      0   . dexlansoprazole (DEXILANT) 60 MG capsule   Oral   Take by mouth.         . DULoxetine (CYMBALTA) 30 MG capsule      1 capsule qd for 2 weeks, then increase to 2 capsules once a day.         . estradiol (ESTRACE) 1 MG tablet   Oral   Take 1 tablet (1 mg total) by mouth daily.   90 tablet   4   . fluticasone (FLONASE) 50 MCG/ACT nasal spray   Nasal   Place 2 sprays into the nose daily.          . Fluticasone-Salmeterol (ADVAIR DISKUS) 500-50 MCG/DOSE AEPB   Inhalation   Inhale 1 puff into  the lungs 2 (two) times daily.   60 each   5   . levalbuterol (XOPENEX HFA) 45 MCG/ACT inhaler   Inhalation   Inhale 1 puff into the lungs every 4 (four) hours as needed for wheezing.   1 Inhaler   12   . montelukast (SINGULAIR) 10 MG tablet   Oral   Take 10 mg by mouth at bedtime.       10   . SPIRIVA RESPIMAT 1.25 MCG/ACT AERS            5     Dispense as written.   . traZODone (DESYREL) 100 MG tablet      Reported on 01/27/2016         . valACYclovir (VALTREX) 500 MG tablet   Oral   Take 500 mg by mouth daily as needed.          . VENTOLIN HFA 108 (90 Base) MCG/ACT inhaler   Inhalation   Inhale 1-2 puffs into the lungs every 4 (four) hours as needed.       10     Dispense as written.      Review of Systems  Constitutional: Negative for fever, chills and malaise/fatigue.  Eyes: Negative for blurred vision and double vision.  Respiratory: Positive for cough and shortness of breath.   Cardiovascular: Positive for chest pain and palpitations.  Gastrointestinal:  Negative for heartburn, nausea and vomiting.  Genitourinary: Negative for dysuria.  Musculoskeletal: Negative for myalgias.  Skin: Negative for itching and rash.  Neurological: Negative for dizziness, weakness and headaches.  Endo/Heme/Allergies: Negative for environmental allergies. Does not bruise/bleed easily.  Psychiatric/Behavioral: Negative for depression.      Allergies:  Sulfa antibiotics  Physical Examination:  VS: BP 116/82 mmHg  Pulse 98  Ht 5\' 1"  (1.549 m)  Wt 143 lb (64.864 kg)  BMI 27.03 kg/m2  SpO2 99%  General Appearance: No distress  HEENT: PERRLA, no ptosis, no other lesions noticed Pulmonary:normal breath sounds., diaphragmatic excursion normal.No wheezing, No rales   Cardiovascular:  Normal S1,S2.  No m/r/g.     Abdomen:Exam: Benign, Soft, non-tender, No masses  Skin:   warm, no rashes, no ecchymosis  Extremities: normal, no cyanosis, clubbing, warm with normal capillary refill.      Rad results: (The following images and results were reviewed by Dr. Stevenson Clinch on 03/16/2016). CT Chest 02/2016 CT CHEST WITHOUT CONTRAST  TECHNIQUE: Multidetector CT imaging of the chest was performed following the standard protocol without intravenous contrast. High resolution imaging of the lungs, as well as inspiratory and expiratory imaging, was performed.  COMPARISON: None.  FINDINGS: Mediastinum/Lymph Nodes: No pathologically enlarged mediastinal or axillary lymph nodes. Hilar regions are difficult to definitively evaluate without IV contrast but appear grossly unremarkable. Heart size normal. No pericardial effusion.  Lungs/Pleura: Linear 3 mm nodule along the minor fissure is indicative of a subpleural lymph node. No subpleural reticulation, traction bronchiectasis/ bronchiolectasis, ground-glass, architectural distortion or honeycombing. Mild dependent atelectasis bilaterally. Minimal scarring in the lingula. No pleural fluid. Airway is unremarkable. No  air trapping.  Upper abdomen: Low-attenuation lesions in the liver measure up to 2.3 cm and are likely cysts in the absence of known malignancy. Visualized portions of the gallbladder, adrenal glands, right kidney unremarkable. Punctate stone in the left kidney. Visualized portions of the spleen, pancreas, stomach and bowel are grossly unremarkable.  Musculoskeletal: No worrisome lytic or sclerotic lesions.  IMPRESSION: 1. No evidence of interstitial lung disease. No findings to explain the patient's  shortness of breath. 2. Punctate left renal stone.   Nuclear Stress Test 02/2016  There was no ST segment deviation noted during stress.  No T wave inversion was noted during stress.  The study is normal.  This is a low risk study. The left ventricular ejection fraction is normal (55-65%).   Assessment and Plan:50 year old female past medical history of seasonal allergies, asthma, seen in follow-up for recurrent dyspnea and chest tightness SOB (shortness of breath) Multifactorial: Asthma, restrictive lung disease, deconditioning, recent infection.  I have reviewed her pulmonary function testing from her previous pulmonologist, last PFT February 2017. Showed mild restriction with FEV1 of 72%, FEV1/FVC 84%, TLC 78, RV 102, DLCO 54%. Given the decrease in DLCO might be some restriction at needs further workup. He has a mild decrease in DLCO in TLC. Follow-up with high-resolution CAT scan did not show any significant findings of interstitial lung disease. She did have a ANA done which was positive, that showed a homogeneous/speckle pattern, however this was done in the setting of acute illness. We'll plan for repeat after cardiopulmonary stress testing (if this is negative).   Plan: -Continue with inhalers as described in plan for asthma -cardiopulmonary stress test prior to follow-up visit    Asthma, moderate persistent Moderate persistent asthma since December 2016. Patient  with 7 rounds of antibiotics, followed by 7 rounds of steroids. Did have symptomatic relief after steroid use which will be temporarily only lasting about one week. Previously well controlled with Advair 500/50, and as needed rescue inhaler. These meds were stopped by primary care physician in May 2016. Over the last 3 months she has been on Breo, Spiriva Respimat, and 3 times a week azithromycin for anti-inflammatory purposes. She is suffering nausea and stomach cramping from days azithromycin and has stopped that over the last 3 weeks. I do not believe that she has COPD, believe her recent viral URI have triggered her bronchospasms and asthma.  Recent high-resolution CAT scan did not show any significant findings for interstitial lung disease. Overall, with no specific etiology that could be triggering chest tightness along with intermittent shortness of breath. Cardiology has ruled out any significant valvular disorders with an echocardiogram or any significant coronary artery disease with a nuclear medicine stress test. Other rare instances that could be causing this could be paroxysmal arrhythmias or even vocal cord dysfunction. I have asked patient to follow-up with her ENT to have a vocal cords evaluated. We will also plan for cardiopulmonary stress test.   Plan: -Advair 500/50, 1 puff twice a day, gargle and rinse after each use -Spiriva capsule, 1 capsule daily, gargle and rinse as each use -Continue with allergy regiment -cardiopulmonary stress testing ENT evaluation of vocal cords -Continue with rescue inhaler as instructed.      Updated Medication List Outpatient Encounter Prescriptions as of 03/16/2016  Medication Sig  . ALPRAZolam (ALPRAZOLAM XR) 1 MG 24 hr tablet Take 1 mg by mouth daily.   Marland Kitchen aspirin EC 81 MG tablet Take 1 tablet (81 mg total) by mouth daily.  Marland Kitchen azelastine (ASTELIN) 0.1 % nasal spray Place 2 sprays into the nose 2 (two) times daily.   Marland Kitchen azithromycin (ZITHROMAX)  500 MG tablet Reported on 02/04/2016  . BREO ELLIPTA 100-25 MCG/INH AEPB   . cetirizine (ZYRTEC) 10 MG tablet Take 10 mg by mouth daily.   Marland Kitchen dexamethasone (DECADRON) 4 MG tablet 4 mg. Take as directed.  Marland Kitchen dexlansoprazole (DEXILANT) 60 MG capsule Take by mouth.  . DULoxetine (CYMBALTA)  30 MG capsule 1 capsule qd for 2 weeks, then increase to 2 capsules once a day.  . estradiol (ESTRACE) 1 MG tablet Take 1 tablet (1 mg total) by mouth daily.  . fluticasone (FLONASE) 50 MCG/ACT nasal spray Place 2 sprays into the nose daily.   . Fluticasone-Salmeterol (ADVAIR DISKUS) 500-50 MCG/DOSE AEPB Inhale 1 puff into the lungs 2 (two) times daily.  Marland Kitchen levalbuterol (XOPENEX HFA) 45 MCG/ACT inhaler Inhale 1 puff into the lungs every 4 (four) hours as needed for wheezing.  . montelukast (SINGULAIR) 10 MG tablet Take 10 mg by mouth at bedtime.   Marland Kitchen SPIRIVA RESPIMAT 1.25 MCG/ACT AERS   . traZODone (DESYREL) 100 MG tablet Reported on 01/27/2016  . valACYclovir (VALTREX) 500 MG tablet Take 500 mg by mouth daily as needed.   . VENTOLIN HFA 108 (90 Base) MCG/ACT inhaler Inhale 1-2 puffs into the lungs every 4 (four) hours as needed.    No facility-administered encounter medications on file as of 03/16/2016.    Orders for this visit: Orders Placed This Encounter  Procedures  . Cardiopulmonary exercise test    Standing Status: Future     Number of Occurrences:      Standing Expiration Date: 03/16/2017    Thank  you for the visitation and for allowing  [facility] to assist in the care of your patient. Our recommendations are noted above.  Please contact us if we can be of further service.  Vilinda Boehringer, MD [facility] Office Number: [phone]  Note: This note was prepared with Dragon dictation along with smaller phrase technology. Any transcriptional errors that result from this process are unintentional.

## 2016-03-29 ENCOUNTER — Ambulatory Visit (HOSPITAL_COMMUNITY): Payer: BLUE CROSS/BLUE SHIELD | Attending: Internal Medicine

## 2016-03-29 DIAGNOSIS — J4540 Moderate persistent asthma, uncomplicated: Secondary | ICD-10-CM

## 2016-03-29 DIAGNOSIS — R0602 Shortness of breath: Secondary | ICD-10-CM

## 2016-04-02 ENCOUNTER — Institutional Professional Consult (permissible substitution): Payer: BLUE CROSS/BLUE SHIELD | Admitting: Pulmonary Disease

## 2016-04-08 HISTORY — PX: CARDIAC CATHETERIZATION: SHX172

## 2016-04-29 IMAGING — CR DG CHEST 2V
1 series · 2 of 2 positions shown · non-contrast
Comparison: 12/25/2015

CLINICAL DATA: Chest tightness, tachycardia, nausea, dyspnea with
exertion and cough.

EXAM:
CHEST - 2 VIEW

[Series 1: dg chest 2 view · 0.14mm/px · 2 of 2 slices shown]
[im 1/2]
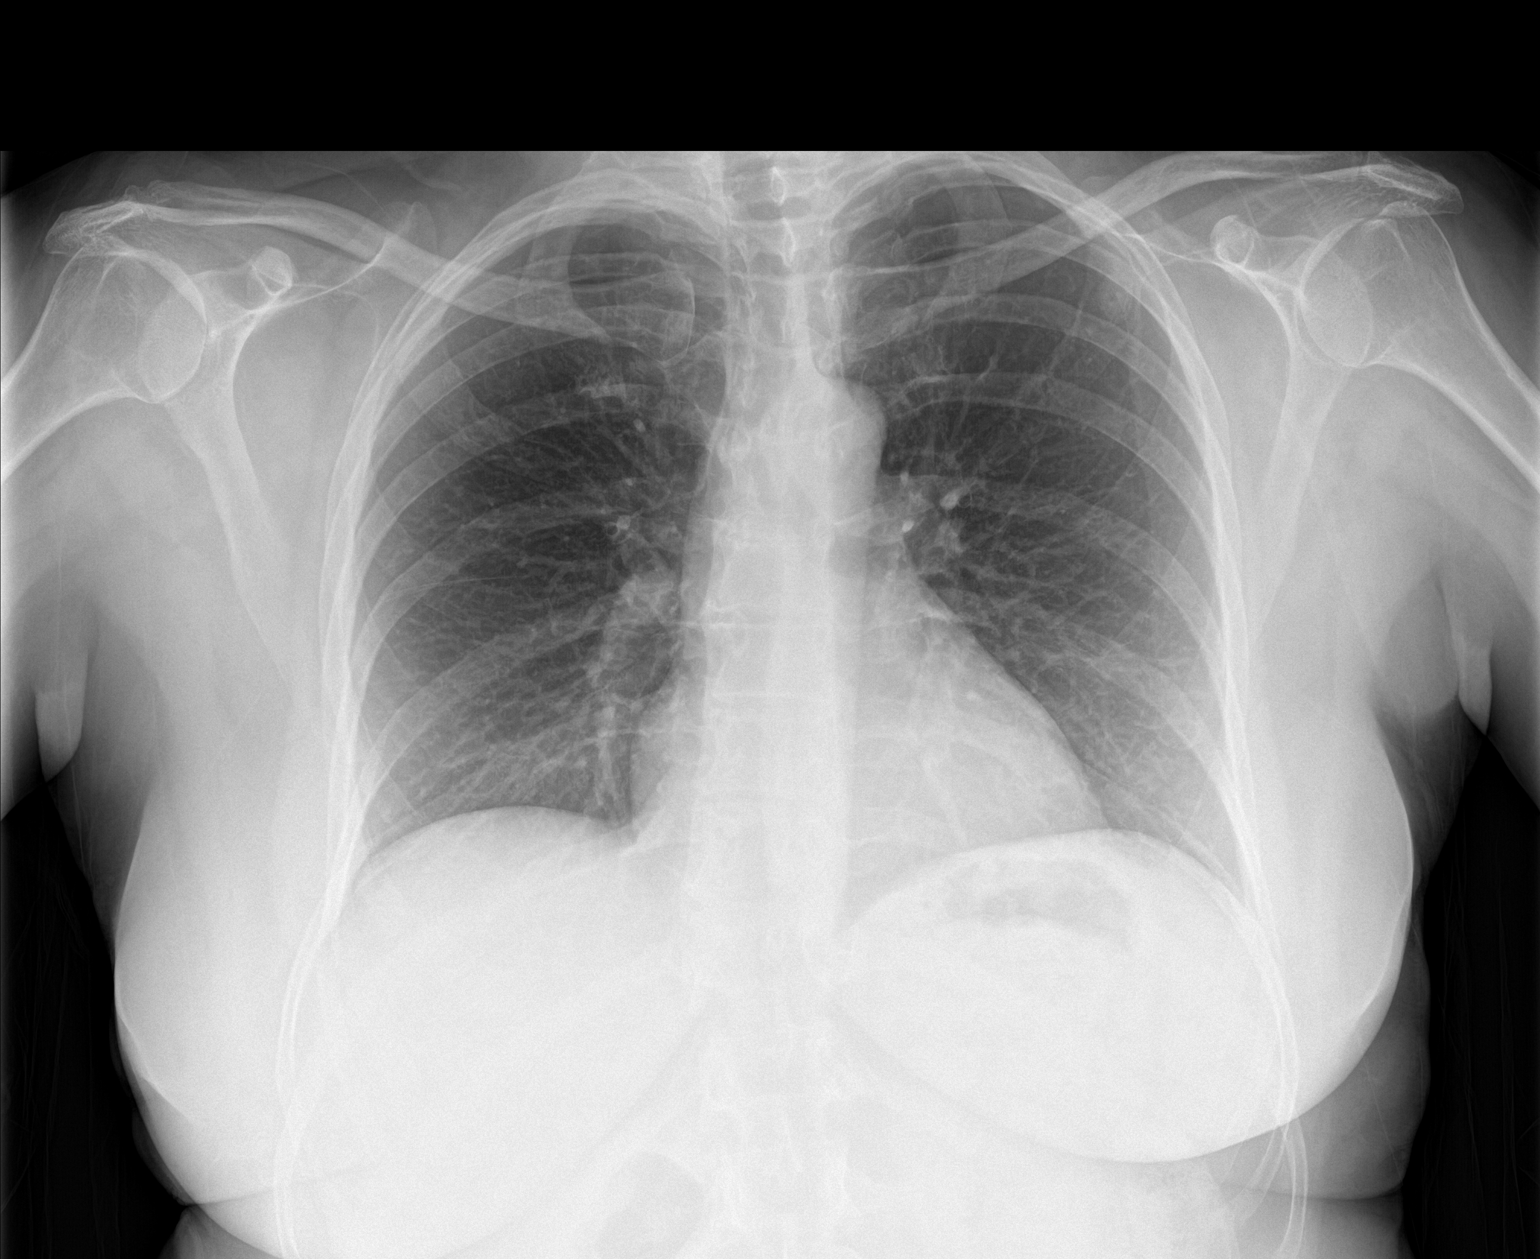
[im 2/2]
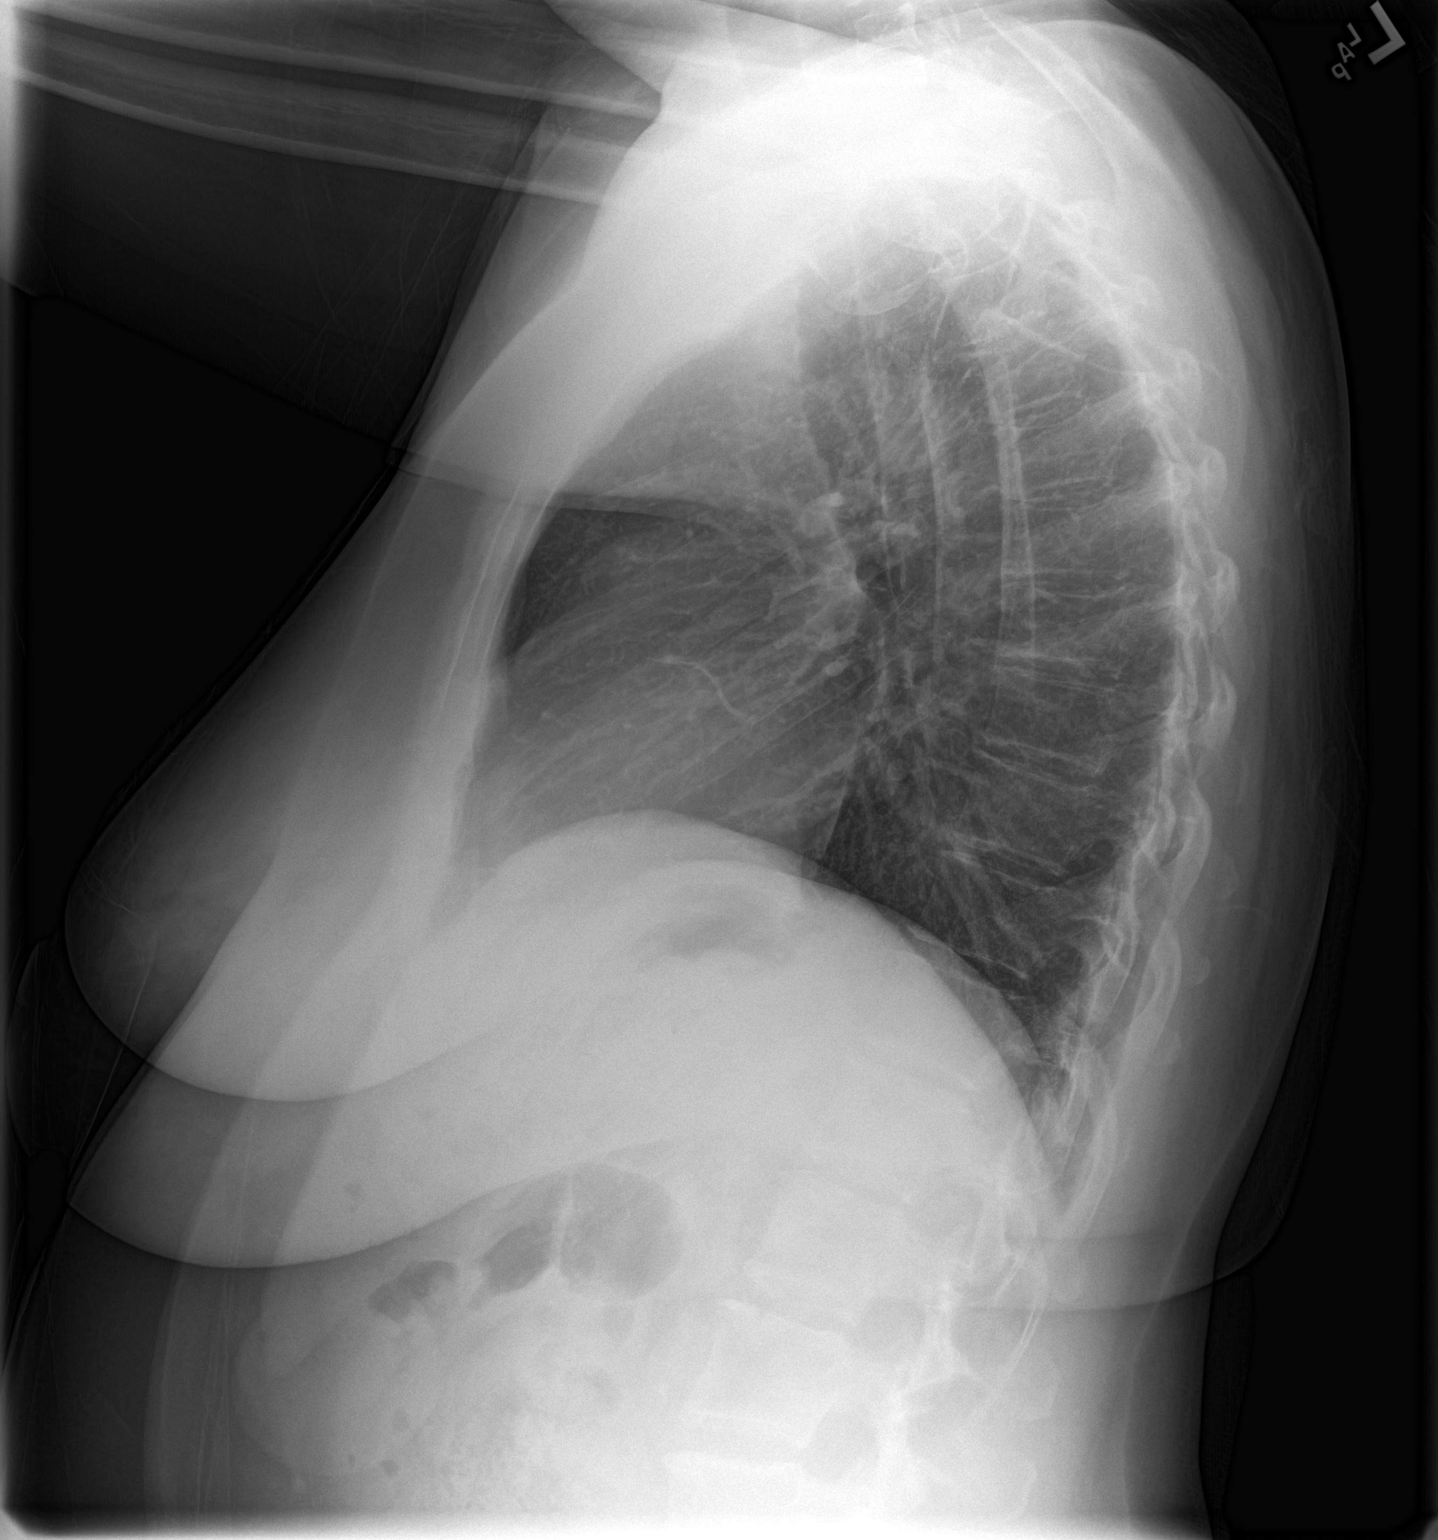

[2 of 2 positions shown; findings below may reference images not displayed]

FINDINGS: The heart size and mediastinal contours are within normal limits.
There is no evidence of pulmonary edema, consolidation,
pneumothorax, nodule or pleural fluid. The visualized skeletal
structures are unremarkable.
IMPRESSION: No active disease.

## 2016-05-13 ENCOUNTER — Ambulatory Visit: Payer: BLUE CROSS/BLUE SHIELD | Attending: Obstetrics and Gynecology

## 2016-05-13 ENCOUNTER — Telehealth: Payer: Self-pay | Admitting: Obstetrics and Gynecology

## 2016-05-13 NOTE — Telephone Encounter (Signed)
Patient called stating her hair is falling out and is getting thinner. Is there a lab that can be done to check for anything? Patient can be reached at [phone]

## 2016-05-19 NOTE — Telephone Encounter (Signed)
Pt will see pcp. She will call back if we need order labs or any referrals.

## 2016-05-25 ENCOUNTER — Ambulatory Visit: Payer: BLUE CROSS/BLUE SHIELD | Admitting: Internal Medicine

## 2016-06-21 ENCOUNTER — Other Ambulatory Visit: Payer: Self-pay | Admitting: Obstetrics and Gynecology

## 2016-06-21 ENCOUNTER — Ambulatory Visit
Admission: RE | Admit: 2016-06-21 | Discharge: 2016-06-21 | Disposition: A | Discharge status: Home / Self Care | Payer: BLUE CROSS/BLUE SHIELD | Source: Ambulatory Visit | Attending: Obstetrics and Gynecology | Admitting: Obstetrics and Gynecology

## 2016-06-21 DIAGNOSIS — Z1231 Encounter for screening mammogram for malignant neoplasm of breast: Secondary | ICD-10-CM | POA: Insufficient documentation

## 2016-06-21 DIAGNOSIS — Z1239 Encounter for other screening for malignant neoplasm of breast: Secondary | ICD-10-CM

## 2016-06-21 DIAGNOSIS — R928 Other abnormal and inconclusive findings on diagnostic imaging of breast: Secondary | ICD-10-CM | POA: Diagnosis not present

## 2016-06-22 ENCOUNTER — Other Ambulatory Visit: Payer: Self-pay | Admitting: Obstetrics and Gynecology

## 2016-06-22 ENCOUNTER — Other Ambulatory Visit: Payer: Self-pay

## 2016-06-22 DIAGNOSIS — R928 Other abnormal and inconclusive findings on diagnostic imaging of breast: Secondary | ICD-10-CM

## 2016-06-22 DIAGNOSIS — N6489 Other specified disorders of breast: Secondary | ICD-10-CM

## 2016-06-24 ENCOUNTER — Ambulatory Visit
Admission: RE | Admit: 2016-06-24 | Discharge: 2016-06-24 | Disposition: A | Discharge status: Home / Self Care | Payer: BLUE CROSS/BLUE SHIELD | Source: Ambulatory Visit | Attending: Obstetrics and Gynecology | Admitting: Obstetrics and Gynecology

## 2016-06-24 DIAGNOSIS — N6489 Other specified disorders of breast: Secondary | ICD-10-CM

## 2016-07-02 ENCOUNTER — Other Ambulatory Visit: Payer: BLUE CROSS/BLUE SHIELD

## 2016-07-02 ENCOUNTER — Ambulatory Visit: Payer: BLUE CROSS/BLUE SHIELD

## 2016-07-28 ENCOUNTER — Ambulatory Visit: Payer: BLUE CROSS/BLUE SHIELD | Admitting: Obstetrics and Gynecology

## 2016-07-29 ENCOUNTER — Ambulatory Visit: Payer: BLUE CROSS/BLUE SHIELD | Admitting: Obstetrics and Gynecology

## 2016-08-11 ENCOUNTER — Ambulatory Visit (INDEPENDENT_AMBULATORY_CARE_PROVIDER_SITE_OTHER): Payer: BLUE CROSS/BLUE SHIELD | Admitting: Obstetrics and Gynecology

## 2016-08-11 ENCOUNTER — Encounter: Payer: Self-pay | Admitting: Obstetrics and Gynecology

## 2016-08-11 VITALS — BP 113/79 | HR 79 | Ht 61.0 in | Wt 145.6 lb

## 2016-08-11 DIAGNOSIS — E663 Overweight: Secondary | ICD-10-CM

## 2016-08-11 MED ORDER — PHENTERMINE HCL 37.5 MG PO TABS: 37.5000 mg | ORAL_TABLET | Freq: Every day | ORAL | 2 refills | Status: DC

## 2016-08-11 MED ORDER — CYANOCOBALAMIN 1000 MCG/ML IJ SOLN: 1000.0000 ug | INTRAMUSCULAR | 1 refills | Status: DC

## 2016-08-11 NOTE — Progress Notes (Signed)
Subjective:  Cheyenne Nash is a 49 y.o. G2P2002 at Unknown being seen today for weight loss management- initial visit.  Patient reports General ROS: positive for  - weight gain and reports previous weight loss attempts: unsuccessful since thyroid diagnosis. Onset was sudden/gradua,  months/year(s) ago.    Pertinent medical history includes: thyroid disorder- now stable   The following portions of the patient's history were reviewed and updated as appropriate: allergies, current medications, past family history, past medical history, past social history, past surgical history and problem list.   Objective:   Vitals:   08/11/16 0946  BP: 113/79  Pulse: 79  Weight: 145 lb 9.6 oz (66 kg)  Height: 5\' 1"  (1.549 m)    General:  Alert, oriented and cooperative. Patient is in no acute distress.  :   :   :   :   :   :   PE: Well groomed female in no current distress,   Mental Status: Normal mood and affect. Normal behavior. Normal judgment and thought content.   Current BMI: Body mass index is 27.51 kg/m.   Assessment and Plan:  Obesity  1. Overweight (BMI 25.0-29.9)   Plan: low carb, High protein diet RX for adipex 37.5 mg daily and B12 1093mcg.ml monthly, to start now with first injection given at today's visit. Reviewed side-effects common to both medications and expected outcomes. Increase daily water intake to at least 8 bottle a day, every day.  Goal is to reduse weight by 10% by end of three months, and will re-evaluate then.  RTC in 4 weeks for Nurse visit to check weight & BP, and get next B12 injections.    Please refer to After Visit Summary for other counseling recommendations.    Melody N [place], CNM   Melody [employer], CNM      Consider the Low Glycemic Index Diet and 6 smaller meals daily .  This boosts your metabolism and regulates your sugars:   Use the protein bar by [employer]kins because they have lots of fiber in them  Find the low carb  flatbreads, tortillas and pita breads for sandwiches:  Joseph's makes a pita bread and a flat bread , available at [facility] and [employer]; [place] makes a low carb flatbread available at [employer] and HT that is 9 net carbs and 100 cal [place] makes a low carb whole wheat tortilla available at [employer] most grocery stores with 6 net carbs and 210 cal  [place] yogurt can still have a lot of carbs .  Dannon Light N fit has 80 cal and 8 carbs

## 2016-11-11 ENCOUNTER — Other Ambulatory Visit: Payer: Self-pay | Admitting: Internal Medicine

## 2016-11-12 ENCOUNTER — Ambulatory Visit (INDEPENDENT_AMBULATORY_CARE_PROVIDER_SITE_OTHER): Payer: BLUE CROSS/BLUE SHIELD | Admitting: Obstetrics and Gynecology

## 2016-11-12 ENCOUNTER — Encounter: Payer: Self-pay | Admitting: Obstetrics and Gynecology

## 2016-11-12 VITALS — BP 119/77 | HR 96 | Ht 62.0 in | Wt 145.6 lb

## 2016-11-12 DIAGNOSIS — Z79899 Other long term (current) drug therapy: Secondary | ICD-10-CM | POA: Diagnosis not present

## 2016-11-12 DIAGNOSIS — E663 Overweight: Secondary | ICD-10-CM

## 2016-11-12 MED ORDER — CYANOCOBALAMIN 1000 MCG/ML IJ SOLN: 1000.0000 ug | INTRAMUSCULAR | 1 refills | Status: DC

## 2016-11-12 MED ORDER — PHENTERMINE HCL 37.5 MG PO TABS: 37.5000 mg | ORAL_TABLET | Freq: Every day | ORAL | 2 refills | Status: DC

## 2016-11-12 MED ORDER — ALPRAZOLAM 1 MG PO TABS: 1.0000 mg | ORAL_TABLET | Freq: Every evening | ORAL | 2 refills | Status: DC | PRN

## 2016-11-12 NOTE — Progress Notes (Signed)
Subjective:     Patient ID: KROSBY BRING, female   DOB: 26-Feb-1966, 50 y.o.   MRN: [id number]  HPI Desires refill on xanax to restart. Has been taking it for 10 years, and PCP tok her off extended release a year ago and weaned her down to 1/2 tablet of 1 mg at bedtime. Has been out for 3 days and having persistent headache. Her PCP has moved out of area. And is waiting to get established with Dr Doy Hutching.   Also wants to restart weight loss meds  Review of Systems negative except stated in HPI    Objective:   Physical Exam A&O x4 Well groomed female in no distress Blood pressure 119/77, pulse 96, height 5\' 2"  (1.575 m), weight 145 lb 9.6 oz (66 kg). No PE indicated    Assessment:     Medication management overweight    Plan:     Xanax 1 mg rx sent in with 2 refills until she can get in with new PCP. adipex and B12 restarted with B12 injection given today RTC 4 weeks for weight check.  >50% of 15 minute visit spent in counseling.  Ceriah [place]ler [place], CNM

## 2016-12-10 ENCOUNTER — Ambulatory Visit: Payer: BLUE CROSS/BLUE SHIELD

## 2017-01-05 ENCOUNTER — Encounter: Payer: Self-pay | Admitting: Certified Nurse Midwife

## 2017-01-05 ENCOUNTER — Telehealth: Payer: Self-pay | Admitting: Obstetrics and Gynecology

## 2017-01-05 ENCOUNTER — Ambulatory Visit (INDEPENDENT_AMBULATORY_CARE_PROVIDER_SITE_OTHER): Payer: Managed Care, Other (non HMO) | Admitting: Certified Nurse Midwife

## 2017-01-05 VITALS — BP 113/72 | HR 86 | Temp 98.4°F | Wt 141.5 lb

## 2017-01-05 DIAGNOSIS — R300 Dysuria: Secondary | ICD-10-CM

## 2017-01-05 DIAGNOSIS — M5440 Lumbago with sciatica, unspecified side: Secondary | ICD-10-CM

## 2017-01-05 LAB — POCT URINALYSIS DIPSTICK
Bilirubin, UA: NEGATIVE
Blood, UA: NEGATIVE
GLUCOSE UA: NEGATIVE
KETONES UA: NEGATIVE
Leukocytes, UA: NEGATIVE
Nitrite, UA: NEGATIVE
Protein, UA: NEGATIVE
SPEC GRAV UA: 1.01
Urobilinogen, UA: NEGATIVE
pH, UA: 6

## 2017-01-05 MED ORDER — ACETAMINOPHEN-CODEINE #2 300-15 MG PO TABS: 1.0000 | ORAL_TABLET | ORAL | 0 refills | Status: DC | PRN

## 2017-01-05 NOTE — Telephone Encounter (Signed)
Cheyenne Nash would you please add her to [name]'s schedule for UTI/ kidney stone???

## 2017-01-05 NOTE — Telephone Encounter (Signed)
Pt called and thinks she has a kidney stone or a UTI and wanted a call back.

## 2017-01-05 NOTE — Progress Notes (Signed)
GYN ENCOUNTER NOTE  Subjective:       Cheyenne Nash is a 51 y.o. G68P2002 female is here for gynecologic evaluation of the following issues:  1. UTI and back pain.  [name] has lower left back pain that is moderate to severe in nature with stabbing pain in her bladder when she urinates. She states that she had a fever last night and this morning and has taken Ibuprofen which has helped. She said that she has a history of kidney stones and is concerned that this may be a stone.    Gynecologic History No LMP recorded. Patient has had a hysterectomy. Contraception: none Last mammogram:06/24/16. Results were: normal  Obstetric History OB History  Gravida Para Term Preterm AB Living  2 2 2     2   SAB TAB Ectopic Multiple Live Births          2    # Outcome Date GA Lbr Len/2nd Weight Sex Delivery Anes PTL Lv  2 Term    4 lb 2.4 oz (1.882 kg) F Vag-Spont   LIV  1 Term    5 lb 2.2 oz (2.331 kg) F Vag-Spont   LIV      Past Medical History:  Diagnosis Date  . Allergic rhinitis   . Anxiety and depression   . Asthma   . COPD (chronic obstructive pulmonary disease) ([place])   . Degenerative disc disease, cervical   . Fatigue   . GERD (gastroesophageal reflux disease)   . Heart murmur   . Lung infection   . Meningitis    h/o recd some cns and chemo infusion  . Menopausal syndrome   . Menopause   . Mitral regurgitation   . Osteopenia   . Tachycardia     Past Surgical History:  Procedure Laterality Date  . ABDOMINAL HYSTERECTOMY     tah lso  . ARTHROSCOPIC REPAIR ACL     LEFT  . CYSTECTOMY Right   . DILATION AND CURETTAGE OF UTERUS    . KNEE SURGERY     LEFT   . LAPAROSCOPY     lysis of adhesion  . LEFT OOPHORECTOMY    . SHOULDER SURGERY     RIGHT    Current Outpatient Prescriptions on File Prior to Visit  Medication Sig Dispense Refill  . ADVAIR DISKUS 500-50 MCG/DOSE AEPB INHALE 1 PUFF INTO THE LUNGS TWICE DAILY 60 each 3  . ALPRAZolam (XANAX) 1 MG tablet Take 1 tablet (1  mg total) by mouth at bedtime as needed for anxiety. 30 tablet 2  . azelastine (ASTELIN) 0.1 % nasal spray Place 2 sprays into the nose 2 (two) times daily.     . cetirizine (ZYRTEC) 10 MG tablet Take 10 mg by mouth daily.     . cyanocobalamin (,VITAMIN B-12,) 1000 MCG/ML injection Inject 1 mL (1,000 mcg total) into the muscle every 30 (thirty) days. 10 mL 1  . dexlansoprazole (DEXILANT) 60 MG capsule Take by mouth.    . DULoxetine (CYMBALTA) 30 MG capsule 1 capsule qd for 2 weeks, then increase to 2 capsules once a day.    . estradiol (ESTRACE) 1 MG tablet Take 1 tablet (1 mg total) by mouth daily. 90 tablet 4  . fluticasone (FLONASE) 50 MCG/ACT nasal spray Place 2 sprays into the nose daily.     Marland Kitchen levalbuterol (XOPENEX HFA) 45 MCG/ACT inhaler Inhale 1 puff into the lungs every 4 (four) hours as needed for wheezing. (Patient not taking: Reported on 11/12/2016)  1 Inhaler 12  . levothyroxine (SYNTHROID, LEVOTHROID) 75 MCG tablet Take 75 mcg by mouth daily before breakfast.    . montelukast (SINGULAIR) 10 MG tablet Take 10 mg by mouth at bedtime.   10  . phentermine (ADIPEX-P) 37.5 MG tablet Take 1 tablet (37.5 mg total) by mouth daily before breakfast. 30 tablet 2  . traZODone (DESYREL) 100 MG tablet Reported on 01/27/2016    . valACYclovir (VALTREX) 500 MG tablet Take 500 mg by mouth daily as needed.     . VENTOLIN HFA 108 (90 Base) MCG/ACT inhaler Inhale 1-2 puffs into the lungs every 4 (four) hours as needed.   10   No current facility-administered medications on file prior to visit.     Allergies  Allergen Reactions  . Sulfa Antibiotics     Hives  Swelling     Social History   Social History  . Marital status: Married    Spouse name: N/A  . Number of children: N/A  . Years of education: N/A   Occupational History  . Not on file.   Social History Main Topics  . Smoking status: Never Smoker  . Smokeless tobacco: Never Used  . Alcohol use No  . Drug use: No  . Sexual  activity: Yes    Birth control/ protection: Surgical   Other Topics Concern  . Not on file   Social History Narrative  . No narrative on file    Family History  Problem Relation Age of Onset  . Hypertension Mother   . Hypertension Father   . Heart disease Father 23    CABG  . Heart attack Father   . Depression Maternal Grandmother   . Cancer Neg Hx   . Breast cancer Neg Hx     The following portions of the patient's history were reviewed and updated as appropriate: allergies, current medications, past family history, past medical history, past social history, past surgical history and problem list.  Review of Systems Review of Systems - Negative except stabbing pain when she urinates and left sided lower back pain. Review of Systems - General ROS: negative for - chills, fatigue, fever. Positive for fever last night & this morning Gastrointestinal ROS: negative except for Nausea last night with severe pain  Musculoskeletal ROS: negative for - joint pain, muscle pain or muscular weakness. Positive for left lower back pain that is aggrevated by certain movements/positions. Pelvic: no vaginal discharge or odor  Objective:   BP 113/72   Pulse 86   Temp 98.4 F (36.9 C)   Wt 141 lb 8 oz (64.2 kg)   BMI 25.88 kg/m  CONSTITUTIONAL: Well-developed, well-nourished female in no acute distress.  HENT:  Normocephalic, atraumatic.  NECK: Normal range of motion SKIN: Skin is warm and dry. No rash noted. Not diaphoretic. No erythema. No pallor. [place]: Alert and oriented to person, place, and time PSYCHIATRIC: Normal mood and affect. Normal behavior. Normal judgment and thought content. CARDIOVASCULAR:Not Examined RESPIRATORY: Not Examined BREASTS: Not Examined ABDOMEN: Not examined Gastrointestinal: negative CVA tenderness PELVIC: not indicated  MUSCULOSKELETAL: Normal range of motion.     Assessment:   1. Dysuria POCT urinalysis dipstick- negative - Urine culture -  US Renal; Future -sample of uribel,   2. Acute left-sided low back pain  continue Ibuprofen prn, prescription for Tylenol #2 for severe pain.     Plan:  Follow up as soon as possible for renal ultrasound Call if pain gets worse, fever, unable to pass urine, or  pass out. Severe abdominal pain, excessive nausea & vomiting.  Philip Aspen, CNM

## 2017-01-05 NOTE — Progress Notes (Signed)
Patient ID: Cheyenne Nash, female   DOB: 1966/02/01, 51 y.o.   MRN: [id number]  Pt worked in today for lower back pain, and pain when voids like a knife sticking her. She states she noted blood in urine yesterday. Also states she had fever last night but did not check it. Urine dip in office negative.

## 2017-01-05 NOTE — Patient Instructions (Addendum)
Urinary Tract Infection, Adult Introduction A urinary tract infection (UTI) is an infection of any part of the urinary tract. The urinary tract includes the:  Kidneys.  Ureters.  Bladder.  Urethra. These organs make, store, and get rid of pee (urine) in the body. Follow these instructions at home:  Take over-the-counter and prescription medicines only as told by your doctor.  If you were prescribed an antibiotic medicine, take it as told by your doctor. Do not stop taking the antibiotic even if you start to feel better.  Avoid the following drinks:  Alcohol.  Caffeine.  Tea.  Carbonated drinks.  Drink enough fluid to keep your pee clear or pale yellow.  Keep all follow-up visits as told by your doctor. This is important.  Make sure to:  Empty your bladder often and completely. Do not to hold pee for long periods of time.  Empty your bladder before and after sex.  Wipe from front to back after a bowel movement if you are female. Use each tissue one time when you wipe. Contact a doctor if:  You have back pain.  You have a fever.  You feel sick to your stomach (nauseous).  You throw up (vomit).  Your symptoms do not get better after 3 days.  Your symptoms go away and then come back. Get help right away if:  You have very bad back pain.  You have very bad lower belly (abdominal) pain.  You are throwing up and cannot keep down any medicines or water. This information is not intended to replace advice given to you by your health care provider. Make sure you discuss any questions you have with your health care provider. Document Released: 04/19/2008 Document Revised: 04/08/2016 Document Reviewed: 09/22/2015  2017 Elsevier  Kidney Stones Kidney stones (urolithiasis) are rock-like masses that form inside of the kidneys. Kidneys are organs that make pee (urine). A kidney stone can cause very bad pain and can block the flow of pee. The stone usually leaves your  body (passes) through your pee. You may need to have a doctor take out the stone. Follow these instructions at home: Eating and drinking  Drink enough fluid to keep your pee clear or pale yellow. This will help you pass the stone.  If told by your doctor, change the foods you eat (your diet). This may include:  Limiting how much salt (sodium) you eat.  Eating more fruits and vegetables.  Limiting how much meat, poultry, fish, and eggs you eat.  Follow instructions from your doctor about eating or drinking restrictions. General instructions  Collect pee samples as told by your doctor. You may need to collect a pee sample:  24 hours after a stone comes out.  8-12 weeks after a stone comes out, and every 6-12 months after that.  Strain your pee every time you pee (urinate), for as long as told. Use the strainer that your doctor recommends.  Do not throw out the stone. Keep it so that it can be tested by your doctor.  Take over-the-counter and prescription medicines only as told by your doctor.  Keep all follow-up visits as told by your doctor. This is important. You may need follow-up tests. Preventing kidney stones To prevent another kidney stone:  Drink enough fluid to keep your pee clear or pale yellow. This is the best way to prevent kidney stones.  Eat healthy foods.  Avoid certain foods as told by your doctor. You may be told to eat less protein.  Stay at a healthy weight. Contact a doctor if:  You have pain that gets worse or does not get better with medicine. Get help right away if:  You have a fever or chills.  You get very bad pain.  You get new pain in your belly (abdomen).  You pass out (faint).  You cannot pee. This information is not intended to replace advice given to you by your health care provider. Make sure you discuss any questions you have with your health care provider. Document Released: 04/19/2008 Document Revised: 07/20/2016 Document  Reviewed: 07/20/2016 Elsevier Interactive Patient Education  2017 [employer].

## 2017-01-07 ENCOUNTER — Encounter: Payer: Self-pay | Admitting: Certified Nurse Midwife

## 2017-01-07 LAB — URINE CULTURE: ORGANISM ID, BACTERIA: NO GROWTH

## 2017-01-28 ENCOUNTER — Other Ambulatory Visit: Payer: Self-pay | Admitting: Nurse Practitioner

## 2017-01-28 DIAGNOSIS — J449 Chronic obstructive pulmonary disease, unspecified: Secondary | ICD-10-CM

## 2017-01-28 DIAGNOSIS — J4540 Moderate persistent asthma, uncomplicated: Secondary | ICD-10-CM

## 2017-02-01 ENCOUNTER — Encounter: Payer: BLUE CROSS/BLUE SHIELD | Admitting: Obstetrics and Gynecology

## 2017-02-22 DIAGNOSIS — E079 Disorder of thyroid, unspecified: Secondary | ICD-10-CM | POA: Insufficient documentation

## 2017-03-02 ENCOUNTER — Other Ambulatory Visit: Payer: Self-pay | Admitting: Obstetrics and Gynecology

## 2017-05-29 ENCOUNTER — Other Ambulatory Visit: Payer: Self-pay | Admitting: Obstetrics and Gynecology

## 2017-10-25 ENCOUNTER — Other Ambulatory Visit: Payer: Self-pay | Admitting: Internal Medicine

## 2017-10-25 ENCOUNTER — Ambulatory Visit
Admission: RE | Admit: 2017-10-25 | Discharge: 2017-10-25 | Disposition: A | Discharge status: Home / Self Care | Payer: Managed Care, Other (non HMO) | Source: Ambulatory Visit | Attending: Internal Medicine | Admitting: Internal Medicine

## 2017-10-25 DIAGNOSIS — Z1231 Encounter for screening mammogram for malignant neoplasm of breast: Secondary | ICD-10-CM | POA: Diagnosis present

## 2017-11-16 DIAGNOSIS — M79641 Pain in right hand: Secondary | ICD-10-CM | POA: Insufficient documentation

## 2018-03-01 ENCOUNTER — Other Ambulatory Visit: Payer: Self-pay | Admitting: Internal Medicine

## 2018-03-02 ENCOUNTER — Other Ambulatory Visit: Payer: Self-pay

## 2018-03-02 MED ORDER — OMEPRAZOLE 40 MG PO CPDR: 40.0000 mg | DELAYED_RELEASE_CAPSULE | Freq: Every day | ORAL | 5 refills | Status: DC

## 2018-03-27 ENCOUNTER — Other Ambulatory Visit: Payer: Self-pay

## 2018-03-27 ENCOUNTER — Telehealth: Payer: Self-pay

## 2018-03-27 NOTE — Telephone Encounter (Signed)
SPOKE WITH PT NEED APPT FOR FURTHER REFILLS

## 2018-03-30 ENCOUNTER — Telehealth: Payer: Self-pay

## 2018-03-30 NOTE — Telephone Encounter (Signed)
Left vm advising pt that we cannot refill her omeprazole due to her not being seen since 01/2017 and that she will need to call and make an appt before we give more refills.  dbs

## 2018-06-09 ENCOUNTER — Other Ambulatory Visit: Payer: Self-pay | Admitting: Internal Medicine

## 2018-06-09 DIAGNOSIS — R1310 Dysphagia, unspecified: Secondary | ICD-10-CM

## 2018-06-09 DIAGNOSIS — E559 Vitamin D deficiency, unspecified: Secondary | ICD-10-CM | POA: Insufficient documentation

## 2018-06-19 ENCOUNTER — Ambulatory Visit
Admission: RE | Admit: 2018-06-19 | Discharge: 2018-06-19 | Disposition: A | Discharge status: Home / Self Care | Payer: BLUE CROSS/BLUE SHIELD | Source: Ambulatory Visit | Attending: Internal Medicine | Admitting: Internal Medicine

## 2018-06-19 DIAGNOSIS — K219 Gastro-esophageal reflux disease without esophagitis: Secondary | ICD-10-CM | POA: Diagnosis not present

## 2018-06-19 DIAGNOSIS — R1310 Dysphagia, unspecified: Secondary | ICD-10-CM | POA: Diagnosis not present

## 2018-08-31 DIAGNOSIS — M1811 Unilateral primary osteoarthritis of first carpometacarpal joint, right hand: Secondary | ICD-10-CM | POA: Insufficient documentation

## 2018-09-01 ENCOUNTER — Telehealth: Payer: Self-pay | Admitting: Obstetrics and Gynecology

## 2018-10-17 ENCOUNTER — Other Ambulatory Visit: Payer: Self-pay | Admitting: Internal Medicine

## 2018-10-17 DIAGNOSIS — R519 Headache, unspecified: Secondary | ICD-10-CM

## 2018-10-17 DIAGNOSIS — R51 Headache: Principal | ICD-10-CM

## 2018-11-06 ENCOUNTER — Other Ambulatory Visit: Payer: Self-pay | Admitting: Internal Medicine

## 2018-11-16 ENCOUNTER — Ambulatory Visit
Admission: RE | Admit: 2018-11-16 | Discharge: 2018-11-16 | Disposition: A | Discharge status: Home / Self Care | Payer: BLUE CROSS/BLUE SHIELD | Source: Ambulatory Visit | Attending: Internal Medicine | Admitting: Internal Medicine

## 2018-11-16 DIAGNOSIS — R519 Headache, unspecified: Secondary | ICD-10-CM

## 2018-11-16 DIAGNOSIS — R51 Headache: Secondary | ICD-10-CM | POA: Diagnosis present

## 2019-06-06 ENCOUNTER — Other Ambulatory Visit: Payer: Self-pay | Admitting: Internal Medicine

## 2019-06-06 DIAGNOSIS — Z1231 Encounter for screening mammogram for malignant neoplasm of breast: Secondary | ICD-10-CM

## 2019-09-07 ENCOUNTER — Other Ambulatory Visit: Payer: Self-pay | Admitting: Internal Medicine

## 2019-09-07 ENCOUNTER — Other Ambulatory Visit (HOSPITAL_COMMUNITY): Payer: Self-pay | Admitting: Internal Medicine

## 2019-09-07 DIAGNOSIS — M25551 Pain in right hip: Secondary | ICD-10-CM

## 2019-09-16 DIAGNOSIS — N6092 Unspecified benign mammary dysplasia of left breast: Secondary | ICD-10-CM | POA: Insufficient documentation

## 2019-09-20 ENCOUNTER — Ambulatory Visit
Admission: RE | Admit: 2019-09-20 | Discharge: 2019-09-20 | Disposition: A | Discharge status: Home / Self Care | Payer: BC Managed Care – PPO | Source: Ambulatory Visit | Attending: Internal Medicine | Admitting: Internal Medicine

## 2019-09-20 ENCOUNTER — Other Ambulatory Visit: Payer: Self-pay

## 2019-09-20 DIAGNOSIS — M25551 Pain in right hip: Secondary | ICD-10-CM | POA: Diagnosis not present

## 2019-09-20 MED ORDER — GADOBUTROL 1 MMOL/ML IV SOLN
6.0000 mL | Freq: Once | INTRAVENOUS | Status: AC | PRN
  Administered 2019-09-20: 6 mL via INTRAVENOUS

## 2019-09-21 ENCOUNTER — Ambulatory Visit
Admission: RE | Admit: 2019-09-21 | Discharge: 2019-09-21 | Disposition: A | Discharge status: Home / Self Care | Payer: BC Managed Care – PPO | Source: Ambulatory Visit | Attending: Internal Medicine | Admitting: Internal Medicine

## 2019-09-21 DIAGNOSIS — Z1231 Encounter for screening mammogram for malignant neoplasm of breast: Secondary | ICD-10-CM | POA: Diagnosis not present

## 2019-09-25 ENCOUNTER — Other Ambulatory Visit: Payer: Self-pay | Admitting: Internal Medicine

## 2019-09-25 DIAGNOSIS — R928 Other abnormal and inconclusive findings on diagnostic imaging of breast: Secondary | ICD-10-CM

## 2019-09-25 DIAGNOSIS — R921 Mammographic calcification found on diagnostic imaging of breast: Secondary | ICD-10-CM

## 2019-09-28 ENCOUNTER — Ambulatory Visit
Admission: RE | Admit: 2019-09-28 | Discharge: 2019-09-28 | Disposition: A | Discharge status: Home / Self Care | Payer: BC Managed Care – PPO | Source: Ambulatory Visit | Attending: Internal Medicine | Admitting: Internal Medicine

## 2019-09-28 DIAGNOSIS — R928 Other abnormal and inconclusive findings on diagnostic imaging of breast: Secondary | ICD-10-CM

## 2019-09-28 DIAGNOSIS — R921 Mammographic calcification found on diagnostic imaging of breast: Secondary | ICD-10-CM | POA: Diagnosis present

## 2019-10-01 ENCOUNTER — Other Ambulatory Visit: Payer: Self-pay | Admitting: Internal Medicine

## 2019-10-01 DIAGNOSIS — R928 Other abnormal and inconclusive findings on diagnostic imaging of breast: Secondary | ICD-10-CM

## 2019-10-02 ENCOUNTER — Other Ambulatory Visit: Payer: Self-pay | Admitting: Internal Medicine

## 2019-10-02 DIAGNOSIS — R928 Other abnormal and inconclusive findings on diagnostic imaging of breast: Secondary | ICD-10-CM

## 2019-10-02 DIAGNOSIS — R921 Mammographic calcification found on diagnostic imaging of breast: Secondary | ICD-10-CM

## 2019-10-03 ENCOUNTER — Ambulatory Visit
Admission: RE | Admit: 2019-10-03 | Discharge: 2019-10-03 | Disposition: A | Discharge status: Home / Self Care | Payer: BC Managed Care – PPO | Source: Ambulatory Visit | Attending: Internal Medicine | Admitting: Internal Medicine

## 2019-10-03 DIAGNOSIS — R921 Mammographic calcification found on diagnostic imaging of breast: Secondary | ICD-10-CM | POA: Insufficient documentation

## 2019-10-03 DIAGNOSIS — R928 Other abnormal and inconclusive findings on diagnostic imaging of breast: Secondary | ICD-10-CM | POA: Insufficient documentation

## 2019-10-03 HISTORY — PX: BREAST BIOPSY: SHX20

## 2019-10-04 LAB — SURGICAL PATHOLOGY

## 2019-10-05 ENCOUNTER — Ambulatory Visit: Payer: BC Managed Care – PPO

## 2019-10-13 ENCOUNTER — Ambulatory Visit
Admission: EM | Admit: 2019-10-13 | Discharge: 2019-10-13 | Disposition: A | Discharge status: Home / Self Care | Payer: BC Managed Care – PPO | Attending: Physician Assistant | Admitting: Physician Assistant

## 2019-10-13 ENCOUNTER — Encounter: Payer: Self-pay | Admitting: Emergency Medicine

## 2019-10-13 ENCOUNTER — Other Ambulatory Visit: Payer: Self-pay

## 2019-10-13 DIAGNOSIS — Z20828 Contact with and (suspected) exposure to other viral communicable diseases: Secondary | ICD-10-CM

## 2019-10-13 DIAGNOSIS — J069 Acute upper respiratory infection, unspecified: Secondary | ICD-10-CM | POA: Diagnosis not present

## 2019-10-13 DIAGNOSIS — Z20822 Contact with and (suspected) exposure to covid-19: Secondary | ICD-10-CM

## 2019-10-13 NOTE — ED Triage Notes (Addendum)
Symptoms started 11/16.  Patient say symptoms have improved, but no sense of taste or smell  Loss of taste and smell noticed 10/06/2019 Patient with spouse that has symptoms

## 2019-10-13 NOTE — Discharge Instructions (Addendum)
COVID testing sent. I would like you to quarantine until testing results. You can take over the counter flonase/nasacort to help with nasal congestion/drainage. If experiencing shortness of breath, trouble breathing, go to the emergency department for further evaluation needed.

## 2019-10-13 NOTE — ED Provider Notes (Signed)
[facility]    CSN: [id number] Arrival date & time: 10/13/19  [id number]      History   Chief Complaint Chief Complaint  Patient presents with  . URI    HPI Cheyenne Nash is a 53 y.o. female.   53 year old female comes in for evaluation after having URI symptoms starting 11/16. Symptoms include congestion, sore throat, headache, diarrhea, abdominal discomfort. These symptoms have resolved. States 1 week ago started having loss of taste/smell that has not resolved. Denies fever, chills, body aches. Denies abdominal pain, nausea, vomiting, diarrhea. Denies shortness of breath. Spouse now having similar symptoms and therefore came in for evaluation. Had positive exposure 2-3 weeks ago.      Past Medical History:  Diagnosis Date  . Allergic rhinitis   . Anxiety and depression   . Asthma   . COPD (chronic obstructive pulmonary disease) ([place])   . Degenerative disc disease, cervical   . Fatigue   . GERD (gastroesophageal reflux disease)   . Heart murmur   . Lung infection   . Meningitis    h/o recd some cns and chemo infusion  . Menopausal syndrome   . Menopause   . Mitral regurgitation   . Osteopenia   . Tachycardia     Patient Active Problem List   Diagnosis Date Noted  . Allergic rhinitis 01/19/2016  . Asthma without status asthmaticus 01/19/2016  . Degeneration of intervertebral disc of cervical region 01/19/2016  . Acid reflux 01/19/2016  . Cephalalgia 01/19/2016  . Climacteric 01/19/2016  . Osteopenia 01/19/2016  . Tachycardia 12/19/2015  . Asthma, moderate persistent 12/19/2015  . Anxiety and depression 09/23/2014  . Pure hypercholesterolemia 09/23/2014    Past Surgical History:  Procedure Laterality Date  . ABDOMINAL HYSTERECTOMY     tah lso  . ARTHROSCOPIC REPAIR ACL     LEFT  . BREAST BIOPSY Left 10/03/2019   affirm stereo bx of calcs, x marker, path pending  . CYSTECTOMY Right   . DILATION AND CURETTAGE OF UTERUS    . KNEE SURGERY     LEFT   . LAPAROSCOPY     lysis of adhesion  . LEFT OOPHORECTOMY    . SHOULDER SURGERY     RIGHT    OB History    Gravida  2   Para  2   Term  2   Preterm      AB      Living  2     SAB      TAB      Ectopic      Multiple      Live Births  2            Home Medications    Prior to Admission medications   Medication Sig Start Date End Date Taking? Authorizing Provider  levothyroxine (SYNTHROID, LEVOTHROID) 75 MCG tablet Take 75 mcg by mouth daily before breakfast.   Yes [provider]  fluticasone (FLONASE) 50 MCG/ACT nasal spray Place 2 sprays into the nose daily.  10/06/15 10/05/16  [provider]  ADVAIR DISKUS 500-50 MCG/DOSE AEPB INHALE 1 PUFF INTO THE LUNGS TWICE DAILY 11/12/16 10/13/19  Flora Lipps, MD  azelastine (ASTELIN) 0.1 % nasal spray Place 2 sprays into the nose 2 (two) times daily.  10/06/15 10/13/19  [provider]  cetirizine (ZYRTEC) 10 MG tablet Take 10 mg by mouth daily.   10/13/19  [provider]  dexlansoprazole (DEXILANT) 60 MG capsule Take by  mouth. 01/19/16 10/13/19  [provider]  DULoxetine (CYMBALTA) 30 MG capsule 1 capsule qd for 2 weeks, then increase to 2 capsules once a day. 07/25/15 10/13/19  [provider]  estradiol (ESTRACE) 1 MG tablet TAKE 1 TABLET BY MOUTH DAILY 05/30/17 10/13/19  Defrancesco, Alanda Slim, MD  levalbuterol (XOPENEX HFA) 45 MCG/ACT inhaler Inhale 1 puff into the lungs every 4 (four) hours as needed for wheezing. Patient not taking: Reported on 11/12/2016 12/19/15 10/13/19  Minna Merritts, MD  montelukast (SINGULAIR) 10 MG tablet Take 10 mg by mouth at bedtime.  12/18/15 10/13/19  [provider]  omeprazole (PRILOSEC) 40 MG capsule Take 1 capsule (40 mg total) by mouth daily. 03/02/18 10/13/19  Ronnell Freshwater, NP  phentermine (ADIPEX-P) 37.5 MG tablet Take 1 tablet (37.5 mg total) by mouth daily before breakfast. 11/12/16 10/13/19  Joylene Igo, CNM  traZODone (DESYREL) 100 MG tablet Reported on 01/27/2016 12/15/15 10/13/19  [provider]  VENTOLIN HFA 108 (90 Base) MCG/ACT inhaler Inhale 1-2 puffs into the lungs every 4 (four) hours as needed.  12/18/15 10/13/19  [provider]    Family History Family History  Problem Relation Age of Onset  . Hypertension Mother   . Hypertension Father   . Heart disease Father 4       CABG  . Heart attack Father   . Depression Maternal Grandmother   . Cancer Neg Hx   . Breast cancer Neg Hx     Social History Social History   Tobacco Use  . Smoking status: Never Smoker  . Smokeless tobacco: Never Used  Substance Use Topics  . Alcohol use: No  . Drug use: No     Allergies   Sulfa antibiotics   Review of Systems Review of Systems  Reason unable to perform ROS: See HPI as above.     Physical Exam Triage Vital Signs ED Triage Vitals [10/13/19 0934]  Enc Vitals Group     BP      Pulse      Resp      Temp      Temp src      SpO2      Weight      Height      Head Circumference      Peak Flow      Pain Score 0     Pain Loc      Pain Edu?      Excl. in [place]?    No data found.  Updated Vital Signs BP (!) 136/95 (BP Location: Right Arm)   Pulse 77   Temp 98.3 F (36.8 C) (Oral)   Resp 18   SpO2 97%   Physical Exam Constitutional:      General: She is not in acute distress.    Appearance: Normal appearance. She is not ill-appearing, toxic-appearing or diaphoretic.  HENT:     Head: Normocephalic and atraumatic.     Mouth/Throat:     Mouth: Mucous membranes are moist.     Pharynx: Oropharynx is clear. Uvula midline.  Neck:     Musculoskeletal: Normal range of motion and neck supple.  Cardiovascular:     Rate and Rhythm: Normal rate and regular rhythm.     Heart sounds: Normal heart sounds. No murmur. No friction rub. No gallop.   Pulmonary:     Effort: Pulmonary effort is normal. No accessory muscle usage, prolonged expiration,  respiratory distress or retractions.  Comments: Lungs clear to auscultation without adventitious lung sounds. Neurological:     General: No focal deficit present.     Mental Status: She is alert and oriented to person, place, and time.      UC Treatments / Results  Labs (all labs ordered are listed, but only abnormal results are displayed) Labs Reviewed  NOVEL CORONAVIRUS, NAA    EKG   Radiology No results found.  Procedures Procedures (including critical care time)  Medications Ordered in UC Medications - No data to display  Initial Impression / Assessment and Plan / UC Course  I have reviewed the triage vital signs and the nursing notes.  Pertinent labs & imaging results that were available during my care of the patient were reviewed by me and considered in my medical decision making (see chart for details).    Given length of symptoms, does not qualify for rapid COVID testing. PCR test ordered. Patient to quarantine until testing results return. If positive, patient has reached requirement to finish quarantine as all symptoms have improved except for loss of taste/smell. Afebrile >24 hours. If negative, patient will have to quarantine for 14 days regardless of symptoms given spouse now tested positive.   No alarming signs on exam.  Patient speaking in full sentences without respiratory distress.  Symptomatic treatment discussed.  Push fluids.  Return precautions given.  Patient expresses understanding and agrees to plan.  Final Clinical Impressions(s) / UC Diagnoses   Final diagnoses:  Viral URI  Exposure to COVID-19 virus   ED Prescriptions    None     PDMP not reviewed this encounter.   Ok Edwards, PA-C 10/13/19 1046

## 2019-10-14 LAB — NOVEL CORONAVIRUS, NAA: SARS-CoV-2, NAA: NOT DETECTED

## 2019-10-23 ENCOUNTER — Telehealth: Payer: Self-pay

## 2019-10-23 NOTE — Telephone Encounter (Signed)
Pt given appt 11/14/19 1:45 PAP ONLY. Pt had annual with PCP. Mammogram showed LEFT Breast abnormality. Biopsy done. Pt seeing surgeon at [facility] 10/24/19. FYI per pt.

## 2019-11-14 ENCOUNTER — Encounter: Payer: Self-pay | Admitting: Obstetrics and Gynecology

## 2019-11-14 ENCOUNTER — Other Ambulatory Visit: Payer: Self-pay

## 2019-11-14 ENCOUNTER — Ambulatory Visit (INDEPENDENT_AMBULATORY_CARE_PROVIDER_SITE_OTHER): Payer: BC Managed Care – PPO | Admitting: Obstetrics and Gynecology

## 2019-11-14 VITALS — BP 119/76 | HR 73 | Ht 62.0 in | Wt 144.4 lb

## 2019-11-14 DIAGNOSIS — Z01419 Encounter for gynecological examination (general) (routine) without abnormal findings: Secondary | ICD-10-CM | POA: Diagnosis not present

## 2019-11-14 DIAGNOSIS — Z79890 Hormone replacement therapy (postmenopausal): Secondary | ICD-10-CM

## 2019-11-14 NOTE — Progress Notes (Signed)
HPI:      [name] is a 53 y.o. [phone] who LMP was No LMP recorded. Patient has had a hysterectomy.  Subjective:   She presents today for her annual examination.  She has recently been diagnosed with a breast mass and is undergoing lumpectomy in the next few weeks.  Is unsure whether this is a cancer or not. Of significant note patient weaned herself off of estrogen HRT but had mood changes and hot flashes that she felt were out of control and so she restarted the medication.  Since restarting her issues have resolved.  She additionally had resolution of pain with intercourse she had been experiencing.  She would very much like to stay on oral estrogen if her breast biopsy is negative.    Hx: The following portions of the patient's history were reviewed and updated as appropriate:             She  has a past medical history of Allergic rhinitis, Anxiety and depression, Asthma, COPD (chronic obstructive pulmonary disease) ([place]), Degenerative disc disease, cervical, Fatigue, GERD (gastroesophageal reflux disease), Heart murmur, Lung infection, Meningitis, Menopausal syndrome, Menopause, Mitral regurgitation, Osteopenia, and Tachycardia. She does not have any pertinent problems on file. She  has a past surgical history that includes Shoulder surgery; Knee surgery; Arthroscopic repair ACL; Abdominal hysterectomy; Left oophorectomy; laparoscopy; Dilation and curettage of uterus; Cystectomy (Right); and Breast biopsy (Left, 10/03/2019). Her family history includes Depression in her maternal grandmother; Heart attack in her father; Heart disease (age of onset: 73) in her father; Hypertension in her father and mother. She  reports that she has never smoked. She has never used smokeless tobacco. She reports that she does not drink alcohol or use drugs. She has a current medication list which includes the following prescription(s): levothyroxine, [DISCONTINUED] estradiol, fluticasone, [DISCONTINUED]  advair diskus, [DISCONTINUED] azelastine, [DISCONTINUED] cetirizine, [DISCONTINUED] dexlansoprazole, [DISCONTINUED] duloxetine, [DISCONTINUED] levalbuterol, [DISCONTINUED] montelukast, [DISCONTINUED] omeprazole, [DISCONTINUED] phentermine, [DISCONTINUED] trazodone, and [DISCONTINUED] ventolin hfa. She is allergic to sulfa antibiotics.       Review of Systems:  Review of Systems  Constitutional: Denied constitutional symptoms, night sweats, recent illness, fatigue, fever, insomnia and weight loss.  Eyes: Denied eye symptoms, eye pain, photophobia, vision change and visual disturbance.  Ears/Nose/Throat/Neck: Denied ear, nose, throat or neck symptoms, hearing loss, nasal discharge, sinus congestion and sore throat.  Cardiovascular: Denied cardiovascular symptoms, arrhythmia, chest pain/pressure, edema, exercise intolerance, orthopnea and palpitations.  Respiratory: Denied pulmonary symptoms, asthma, pleuritic pain, productive sputum, cough, dyspnea and wheezing.  Gastrointestinal: Denied, gastro-esophageal reflux, melena, nausea and vomiting.  Genitourinary: Denied genitourinary symptoms including symptomatic vaginal discharge, pelvic relaxation issues, and urinary complaints.  Musculoskeletal: Denied musculoskeletal symptoms, stiffness, swelling, muscle weakness and myalgia.  Dermatologic: Denied dermatology symptoms, rash and scar.  Neurologic: Denied neurology symptoms, dizziness, headache, neck pain and syncope.  Psychiatric: Denied psychiatric symptoms, anxiety and depression.  Endocrine: Denied endocrine symptoms including hot flashes and night sweats.   Meds:   Current Outpatient Medications on File Prior to Visit  Medication Sig Dispense Refill  . levothyroxine (SYNTHROID, LEVOTHROID) 75 MCG tablet Take 75 mcg by mouth daily before breakfast.    . [DISCONTINUED] estradiol (ESTRACE) 1 MG tablet TAKE 1 TABLET BY MOUTH DAILY 30 tablet 0  . fluticasone (FLONASE) 50 MCG/ACT nasal spray  Place 2 sprays into the nose daily.     . [DISCONTINUED] ADVAIR DISKUS 500-50 MCG/DOSE AEPB INHALE 1 PUFF INTO THE LUNGS TWICE DAILY 60 each 3  . [  DISCONTINUED] azelastine (ASTELIN) 0.1 % nasal spray Place 2 sprays into the nose 2 (two) times daily.     . [DISCONTINUED] cetirizine (ZYRTEC) 10 MG tablet Take 10 mg by mouth daily.     . [DISCONTINUED] dexlansoprazole (DEXILANT) 60 MG capsule Take by mouth.    . [DISCONTINUED] DULoxetine (CYMBALTA) 30 MG capsule 1 capsule qd for 2 weeks, then increase to 2 capsules once a day.    . [DISCONTINUED] levalbuterol (XOPENEX HFA) 45 MCG/ACT inhaler Inhale 1 puff into the lungs every 4 (four) hours as needed for wheezing. (Patient not taking: Reported on 11/12/2016) 1 Inhaler 12  . [DISCONTINUED] montelukast (SINGULAIR) 10 MG tablet Take 10 mg by mouth at bedtime.   10  . [DISCONTINUED] omeprazole (PRILOSEC) 40 MG capsule Take 1 capsule (40 mg total) by mouth daily. 30 capsule 5  . [DISCONTINUED] phentermine (ADIPEX-P) 37.5 MG tablet Take 1 tablet (37.5 mg total) by mouth daily before breakfast. 30 tablet 2  . [DISCONTINUED] traZODone (DESYREL) 100 MG tablet Reported on 01/27/2016    . [DISCONTINUED] VENTOLIN HFA 108 (90 Base) MCG/ACT inhaler Inhale 1-2 puffs into the lungs every 4 (four) hours as needed.   10   No current facility-administered medications on file prior to visit.    Objective:     Vitals:   11/14/19 1347  BP: 119/76  Pulse: 73              Physical examination General NAD, Conversant  HEENT Atraumatic; Op clear with mmm.  Normo-cephalic. Pupils reactive. Anicteric sclerae  Thyroid/Neck Smooth without nodularity or enlargement. Normal ROM.  Neck Supple.  Skin No rashes, lesions or ulceration. Normal palpated skin turgor. No nodularity.  Breasts:  Deferred  Lungs: Clear to auscultation.No rales or wheezes. Normal Respiratory effort, no retractions.  Heart: NSR.  No murmurs or rubs appreciated. No periferal edema  Abdomen: Soft.   Non-tender.  No masses.  No HSM. No hernia  Extremities: Moves all appropriately.  Normal ROM for age. No lymphadenopathy.  Neuro: Oriented to PPT.  Normal mood. Normal affect.     Pelvic:   Vulva: Normal appearance.  No lesions.   Vagina: No lesions or abnormalities noted.  Support: Normal pelvic support.  Urethra No masses tenderness or scarring.  Meatus Normal size without lesions or prolapse.  Cervix: Surgically absent   Anus: Normal exam.  No lesions.  Perineum: Normal exam.  No lesions.        Bimanual   Uterus: Surgically absent   Adnexae: No masses.  Non-tender to palpation.  Cul-de-sac: Negative for abnormality.     Assessment:    [id number] Patient Active Problem List   Diagnosis Date Noted  . Allergic rhinitis 01/19/2016  . Asthma without status asthmaticus 01/19/2016  . Degeneration of intervertebral disc of cervical region 01/19/2016  . Acid reflux 01/19/2016  . Cephalalgia 01/19/2016  . Climacteric 01/19/2016  . Osteopenia 01/19/2016  . Tachycardia 12/19/2015  . Asthma, moderate persistent 12/19/2015  . Anxiety and depression 09/23/2014  . Pure hypercholesterolemia 09/23/2014     1. Well woman exam with routine gynecological exam   2. Postmenopausal hormone therapy        Plan:            1.  Basic Screening Recommendations The basic screening recommendations for asymptomatic women were discussed with the patient during her visit.  The age-appropriate recommendations were discussed with her and the rational for the tests reviewed.  When I am informed by the patient  that another primary care physician has previously obtained the age-appropriate tests and they are up-to-date, only outstanding tests are ordered and referrals given as necessary.  Abnormal results of tests will be discussed with her when all of her results are completed.  Routine preventative health maintenance measures emphasized: Exercise/Diet/Weight control, Tobacco Warnings, Alcohol/Substance  use risks and Stress Management Patient without a cervix and no history of dysplasia she does not need a Pap smear 2.  Possible breast cancer-I have discussed the use of estrogen and breast cancer in great detail with her.  I have recommended that if she is diagnosed with cancer she stopped the estrogen.  She is unhappy with this thought.  If she does not have breast cancer she plans to continue estrogen.  I have asked her to contact me once her diagnosis is confirmed. BRCA testing and literature given and patient will decide if she would like this testing.  She will also talk about it with her breast surgeon. Orders No orders of the defined types were placed in this encounter.   No orders of the defined types were placed in this encounter.       F/U  Return in about 1 year (around 11/13/2020) for Annual Physical.  Finis Bud, M.D. 11/14/2019 1:59 PM

## 2019-11-16 HISTORY — PX: BREAST LUMPECTOMY: SHX2

## 2020-03-06 ENCOUNTER — Other Ambulatory Visit: Payer: Self-pay | Admitting: Family Medicine

## 2020-03-06 DIAGNOSIS — R110 Nausea: Secondary | ICD-10-CM

## 2020-03-06 DIAGNOSIS — R10811 Right upper quadrant abdominal tenderness: Secondary | ICD-10-CM

## 2020-03-06 DIAGNOSIS — R197 Diarrhea, unspecified: Secondary | ICD-10-CM

## 2020-03-11 ENCOUNTER — Other Ambulatory Visit: Payer: Self-pay

## 2020-03-11 ENCOUNTER — Ambulatory Visit
Admission: RE | Admit: 2020-03-11 | Discharge: 2020-03-11 | Disposition: A | Discharge status: Home / Self Care | Payer: BC Managed Care – PPO | Source: Ambulatory Visit | Attending: Family Medicine | Admitting: Family Medicine

## 2020-03-11 DIAGNOSIS — R197 Diarrhea, unspecified: Secondary | ICD-10-CM | POA: Insufficient documentation

## 2020-03-11 DIAGNOSIS — R10811 Right upper quadrant abdominal tenderness: Secondary | ICD-10-CM | POA: Diagnosis present

## 2020-03-11 DIAGNOSIS — R110 Nausea: Secondary | ICD-10-CM | POA: Insufficient documentation

## 2020-03-14 ENCOUNTER — Ambulatory Visit: Payer: BC Managed Care – PPO

## 2020-03-27 ENCOUNTER — Encounter: Payer: Self-pay | Admitting: Gastroenterology

## 2020-03-27 ENCOUNTER — Other Ambulatory Visit: Payer: Self-pay

## 2020-03-27 ENCOUNTER — Ambulatory Visit (INDEPENDENT_AMBULATORY_CARE_PROVIDER_SITE_OTHER): Payer: BC Managed Care – PPO | Admitting: Gastroenterology

## 2020-03-27 VITALS — BP 128/79 | HR 86 | Temp 97.4°F | Ht 62.0 in | Wt 151.0 lb

## 2020-03-27 DIAGNOSIS — R112 Nausea with vomiting, unspecified: Secondary | ICD-10-CM

## 2020-03-27 DIAGNOSIS — R197 Diarrhea, unspecified: Secondary | ICD-10-CM

## 2020-03-27 DIAGNOSIS — R194 Change in bowel habit: Secondary | ICD-10-CM | POA: Diagnosis not present

## 2020-03-27 DIAGNOSIS — K219 Gastro-esophageal reflux disease without esophagitis: Secondary | ICD-10-CM

## 2020-03-27 MED ORDER — DICYCLOMINE HCL 10 MG PO CAPS: 10.0000 mg | ORAL_CAPSULE | Freq: Three times a day (TID) | ORAL | 3 refills | Status: DC

## 2020-03-27 MED ORDER — SUTAB 1479-225-188 MG PO TABS: 376.0000 mg | ORAL_TABLET | ORAL | 0 refills | Status: DC

## 2020-03-27 MED ORDER — SUPREP BOWEL PREP KIT 17.5-3.13-1.6 GM/177ML PO SOLN: 1.0000 | ORAL | 0 refills | Status: DC

## 2020-03-27 NOTE — Progress Notes (Signed)
Gastroenterology Consultation  Referring Provider:     Herbert Pun, * Primary Care Physician:  Idelle Crouch, MD Primary Gastroenterologist:  Dr. Allen Norris     Reason for Consultation:     Right upper quadrant pain and irritable bowel syndrome        HPI:   ARYANN LEVIER is a 54 y.o. y/o female referred for consultation & management of right upper quadrant pain and irritable bowel syndrome by Dr. Doy Hutching, Leonie Douglas, MD.  This patient comes in today after being seen by surgery for cholelithiasis and right upper quadrant discomfort that radiates to her back.  The patient reports that many different foods will cause her to have the pain.  She also has a history of irritable bowel syndrome with diarrhea predominance.  She had a colonoscopy by me back in 2016.  There is concerned that her symptoms may be multifactorial and not improved with a cholecystectomy so further evaluation was recommended by gastroenterology.  The patient reports that her symptoms all started when she went on a keto diet and around the same time she had a breast biopsy for a lesion found.  She states that she was given antibiotics at that time and also noticed a change when she started the keto diet.  She now reports that she has right middle quadrant abdominal pain with some radiation to the back and epigastric area.  She denies that the diarrhea wakes her up from sleep.  She also had a colonoscopy in 2016 with 2 adenomas found at that time and a repeat colonoscopy requested in 5 years.  The patient also reports that she was put on omeprazole around the same time the diarrhea started.  She also reports that she has frequent nausea without vomiting.  Past Medical History:  Diagnosis Date  . Allergic rhinitis   . Anxiety and depression   . Asthma   . COPD (chronic obstructive pulmonary disease) ([place])   . Degenerative disc disease, cervical   . Fatigue   . GERD (gastroesophageal reflux disease)   . Heart murmur     . Lung infection   . Meningitis    h/o recd some cns and chemo infusion  . Menopausal syndrome   . Menopause   . Mitral regurgitation   . Osteopenia   . Tachycardia     Past Surgical History:  Procedure Laterality Date  . ABDOMINAL HYSTERECTOMY     tah lso  . ARTHROSCOPIC REPAIR ACL     LEFT  . BREAST BIOPSY Left 10/03/2019   affirm stereo bx of calcs, x marker, path pending  . CYSTECTOMY Right   . DILATION AND CURETTAGE OF UTERUS    . KNEE SURGERY     LEFT   . LAPAROSCOPY     lysis of adhesion  . LEFT OOPHORECTOMY    . SHOULDER SURGERY     RIGHT    Prior to Admission medications   Medication Sig Start Date End Date Taking? Authorizing Provider  fluticasone (FLONASE) 50 MCG/ACT nasal spray Place 2 sprays into the nose daily.  10/06/15 10/05/16  [provider]  levothyroxine (SYNTHROID, LEVOTHROID) 75 MCG tablet Take 75 mcg by mouth daily before breakfast.    [provider]  ADVAIR DISKUS 500-50 MCG/DOSE AEPB INHALE 1 PUFF INTO THE LUNGS TWICE DAILY 11/12/16 10/13/19  Flora Lipps, MD  azelastine (ASTELIN) 0.1 % nasal spray Place 2 sprays into the nose 2 (two) times daily.  10/06/15 10/13/19  [provider]  cetirizine (ZYRTEC) 10 MG tablet Take 10 mg by mouth daily.   10/13/19  [provider]  dexlansoprazole (DEXILANT) 60 MG capsule Take by mouth. 01/19/16 10/13/19  [provider]  DULoxetine (CYMBALTA) 30 MG capsule 1 capsule qd for 2 weeks, then increase to 2 capsules once a day. 07/25/15 10/13/19  [provider]  estradiol (ESTRACE) 1 MG tablet TAKE 1 TABLET BY MOUTH DAILY 05/30/17 11/14/19  Defrancesco, Alanda Slim, MD  levalbuterol (XOPENEX HFA) 45 MCG/ACT inhaler Inhale 1 puff into the lungs every 4 (four) hours as needed for wheezing. Patient not taking: Reported on 11/12/2016 12/19/15 10/13/19  Minna Merritts, MD  montelukast (SINGULAIR) 10 MG tablet Take 10 mg by mouth at bedtime.  12/18/15 10/13/19  [provider]  omeprazole (PRILOSEC) 40 MG capsule Take 1 capsule (40 mg total) by mouth daily. 03/02/18 10/13/19  Ronnell Freshwater, NP  phentermine (ADIPEX-P) 37.5 MG tablet Take 1 tablet (37.5 mg total) by mouth daily before breakfast. 11/12/16 10/13/19  Joylene Igo, CNM  traZODone (DESYREL) 100 MG tablet Reported on 01/27/2016 12/15/15 10/13/19  [provider]  VENTOLIN HFA 108 (90 Base) MCG/ACT inhaler Inhale 1-2 puffs into the lungs every 4 (four) hours as needed.  12/18/15 10/13/19  [provider]    Family History  Problem Relation Age of Onset  . Hypertension Mother   . Hypertension Father   . Heart disease Father 57       CABG  . Heart attack Father   . Depression Maternal Grandmother   . Cancer Neg Hx   . Breast cancer Neg Hx      Social History   Tobacco Use  . Smoking status: Never Smoker  . Smokeless tobacco: Never Used  Substance Use Topics  . Alcohol use: No  . Drug use: No    Allergies as of 03/27/2020 - Review Complete 11/14/2019  Allergen Reaction Noted  . Sulfa antibiotics  12/19/2015    Review of Systems:    All systems reviewed and negative except where noted in HPI.   Physical Exam:  There were no vitals taken for this visit. No LMP recorded. Patient has had a hysterectomy. General:   Alert,  Well-developed, well-nourished, pleasant and cooperative in NAD Head:  Normocephalic and atraumatic. Eyes:  Sclera clear, no icterus.   Conjunctiva pink. Ears:  Normal auditory acuity. Neck:  Supple; no masses or thyromegaly. Lungs:  Respirations even and unlabored.  Clear throughout to auscultation.   No wheezes, crackles, or rhonchi. No acute distress. Heart:  Regular rate and rhythm; no murmurs, clicks, rubs, or gallops. Abdomen:  Normal bowel sounds.  No bruits.  Soft, non-tender and non-distended without masses, hepatosplenomegaly or hernias noted.  No guarding or rebound tenderness.  Negative Carnett sign.   Rectal:  Deferred.    Pulses:  Normal pulses noted. Extremities:  No clubbing or edema.  No cyanosis. Neurologic:  Alert and oriented x3;  grossly normal neurologically. Skin:  Intact without significant lesions or rashes.  No jaundice. Lymph Nodes:  No significant cervical adenopathy. Psych:  Alert and cooperative. Normal mood and affect.  Imaging Studies: US Abdomen Limited RUQ  Result Date: 03/11/2020 CLINICAL DATA:  Diarrhea, right upper quadrant abdominal tenderness, nausea EXAM: ULTRASOUND ABDOMEN LIMITED RIGHT UPPER QUADRANT COMPARISON:  None. FINDINGS: Gallbladder: Gallstones. No gallbladder wall thickening. No sonographic Murphy sign noted by sonographer. Common bile duct: Diameter: 5 mm Liver: Liver cysts measuring up to 3.1 cm. Within normal  limits in parenchymal echogenicity. Portal vein is patent on color Doppler imaging with normal direction of blood flow towards the liver. Other: None. IMPRESSION: Cholelithiasis. No gallbladder wall thickening. No pericholecystic fluid. No biliary ductal dilatation negative sonographic Murphy sign. Electronically Signed   By: Eddie Candle M.D.   On: 03/11/2020 15:25    Assessment and Plan:   ELVIRA COPPS is a 54 y.o. y/o female who comes in today with a change in bowel habits over the last few months that she reported to be temporally related to getting antibiotics for a breast biopsy, starting keto diet and being restarted on her PPI for heartburn.  The patient will be switched from omeprazole to [place] that she has been given samples of this.  She also has crampy abdominal pain after eating except when she eats chicken and potatoes.  The patient will be given a prescription for dicyclomine to be taken 3 times a day.  She will also be set up for a EGD and colonoscopy due to her history of polyps and GERD with nausea.  The patient has been told that having gallstones does not mean she is having a gallbladder problem and that 25% of the general public have gallstones.   She has also been told that if her gallbladder is not the problem her symptoms will not improve.  She has also been encouraged to start a probiotic.  The patient has been explained the plan agrees with it.    Lucilla Lame, MD. [facility] Regal    Note: This dictation was prepared with Dragon dictation along with smaller phrase technology. Any transcriptional errors that result from this process are unintentional.

## 2020-03-27 NOTE — H&P (View-Only) (Signed)
Gastroenterology Consultation  Referring Provider:     Herbert Pun, * Primary Care Physician:  Idelle Crouch, MD Primary Gastroenterologist:  Dr. Allen Norris     Reason for Consultation:     Right upper quadrant pain and irritable bowel syndrome        HPI:   Cheyenne Nash is a 54 y.o. y/o female referred for consultation & management of right upper quadrant pain and irritable bowel syndrome by Dr. Doy Hutching, Leonie Douglas, MD.  This patient comes in today after being seen by surgery for cholelithiasis and right upper quadrant discomfort that radiates to her back.  The patient reports that many different foods will cause her to have the pain.  She also has a history of irritable bowel syndrome with diarrhea predominance.  She had a colonoscopy by me back in 2016.  There is concerned that her symptoms may be multifactorial and not improved with a cholecystectomy so further evaluation was recommended by gastroenterology.  The patient reports that her symptoms all started when she went on a keto diet and around the same time she had a breast biopsy for a lesion found.  She states that she was given antibiotics at that time and also noticed a change when she started the keto diet.  She now reports that she has right middle quadrant abdominal pain with some radiation to the back and epigastric area.  She denies that the diarrhea wakes her up from sleep.  She also had a colonoscopy in 2016 with 2 adenomas found at that time and a repeat colonoscopy requested in 5 years.  The patient also reports that she was put on omeprazole around the same time the diarrhea started.  She also reports that she has frequent nausea without vomiting.  Past Medical History:  Diagnosis Date  . Allergic rhinitis   . Anxiety and depression   . Asthma   . COPD (chronic obstructive pulmonary disease) ([place])   . Degenerative disc disease, cervical   . Fatigue   . GERD (gastroesophageal reflux disease)   . Heart murmur     . Lung infection   . Meningitis    h/o recd some cns and chemo infusion  . Menopausal syndrome   . Menopause   . Mitral regurgitation   . Osteopenia   . Tachycardia     Past Surgical History:  Procedure Laterality Date  . ABDOMINAL HYSTERECTOMY     tah lso  . ARTHROSCOPIC REPAIR ACL     LEFT  . BREAST BIOPSY Left 10/03/2019   affirm stereo bx of calcs, x marker, path pending  . CYSTECTOMY Right   . DILATION AND CURETTAGE OF UTERUS    . KNEE SURGERY     LEFT   . LAPAROSCOPY     lysis of adhesion  . LEFT OOPHORECTOMY    . SHOULDER SURGERY     RIGHT    Prior to Admission medications   Medication Sig Start Date End Date Taking? Authorizing Provider  fluticasone (FLONASE) 50 MCG/ACT nasal spray Place 2 sprays into the nose daily.  10/06/15 10/05/16  [provider]  levothyroxine (SYNTHROID, LEVOTHROID) 75 MCG tablet Take 75 mcg by mouth daily before breakfast.    [provider]  ADVAIR DISKUS 500-50 MCG/DOSE AEPB INHALE 1 PUFF INTO THE LUNGS TWICE DAILY 11/12/16 10/13/19  Flora Lipps, MD  azelastine (ASTELIN) 0.1 % nasal spray Place 2 sprays into the nose 2 (two) times daily.  10/06/15 10/13/19  [provider]  cetirizine (ZYRTEC) 10 MG tablet Take 10 mg by mouth daily.   10/13/19  [provider]  dexlansoprazole (DEXILANT) 60 MG capsule Take by mouth. 01/19/16 10/13/19  [provider]  DULoxetine (CYMBALTA) 30 MG capsule 1 capsule qd for 2 weeks, then increase to 2 capsules once a day. 07/25/15 10/13/19  [provider]  estradiol (ESTRACE) 1 MG tablet TAKE 1 TABLET BY MOUTH DAILY 05/30/17 11/14/19  Defrancesco, Alanda Slim, MD  levalbuterol (XOPENEX HFA) 45 MCG/ACT inhaler Inhale 1 puff into the lungs every 4 (four) hours as needed for wheezing. Patient not taking: Reported on 11/12/2016 12/19/15 10/13/19  Minna Merritts, MD  montelukast (SINGULAIR) 10 MG tablet Take 10 mg by mouth at bedtime.  12/18/15 10/13/19  [provider]  omeprazole (PRILOSEC) 40 MG capsule Take 1 capsule (40 mg total) by mouth daily. 03/02/18 10/13/19  Ronnell Freshwater, NP  phentermine (ADIPEX-P) 37.5 MG tablet Take 1 tablet (37.5 mg total) by mouth daily before breakfast. 11/12/16 10/13/19  Joylene Igo, CNM  traZODone (DESYREL) 100 MG tablet Reported on 01/27/2016 12/15/15 10/13/19  [provider]  VENTOLIN HFA 108 (90 Base) MCG/ACT inhaler Inhale 1-2 puffs into the lungs every 4 (four) hours as needed.  12/18/15 10/13/19  [provider]    Family History  Problem Relation Age of Onset  . Hypertension Mother   . Hypertension Father   . Heart disease Father 45       CABG  . Heart attack Father   . Depression Maternal Grandmother   . Cancer Neg Hx   . Breast cancer Neg Hx      Social History   Tobacco Use  . Smoking status: Never Smoker  . Smokeless tobacco: Never Used  Substance Use Topics  . Alcohol use: No  . Drug use: No    Allergies as of 03/27/2020 - Review Complete 11/14/2019  Allergen Reaction Noted  . Sulfa antibiotics  12/19/2015    Review of Systems:    All systems reviewed and negative except where noted in HPI.   Physical Exam:  There were no vitals taken for this visit. No LMP recorded. Patient has had a hysterectomy. General:   Alert,  Well-developed, well-nourished, pleasant and cooperative in NAD Head:  Normocephalic and atraumatic. Eyes:  Sclera clear, no icterus.   Conjunctiva pink. Ears:  Normal auditory acuity. Neck:  Supple; no masses or thyromegaly. Lungs:  Respirations even and unlabored.  Clear throughout to auscultation.   No wheezes, crackles, or rhonchi. No acute distress. Heart:  Regular rate and rhythm; no murmurs, clicks, rubs, or gallops. Abdomen:  Normal bowel sounds.  No bruits.  Soft, non-tender and non-distended without masses, hepatosplenomegaly or hernias noted.  No guarding or rebound tenderness.  Negative Carnett sign.   Rectal:  Deferred.    Pulses:  Normal pulses noted. Extremities:  No clubbing or edema.  No cyanosis. Neurologic:  Alert and oriented x3;  grossly normal neurologically. Skin:  Intact without significant lesions or rashes.  No jaundice. Lymph Nodes:  No significant cervical adenopathy. Psych:  Alert and cooperative. Normal mood and affect.  Imaging Studies: US Abdomen Limited RUQ  Result Date: 03/11/2020 CLINICAL DATA:  Diarrhea, right upper quadrant abdominal tenderness, nausea EXAM: ULTRASOUND ABDOMEN LIMITED RIGHT UPPER QUADRANT COMPARISON:  None. FINDINGS: Gallbladder: Gallstones. No gallbladder wall thickening. No sonographic Murphy sign noted by sonographer. Common bile duct: Diameter: 5 mm Liver: Liver cysts measuring up to 3.1 cm. Within normal  limits in parenchymal echogenicity. Portal vein is patent on color Doppler imaging with normal direction of blood flow towards the liver. Other: None. IMPRESSION: Cholelithiasis. No gallbladder wall thickening. No pericholecystic fluid. No biliary ductal dilatation negative sonographic Murphy sign. Electronically Signed   By: Eddie Candle M.D.   On: 03/11/2020 15:25    Assessment and Plan:   DOMENIQUE SEEDS is a 54 y.o. y/o female who comes in today with a change in bowel habits over the last few months that she reported to be temporally related to getting antibiotics for a breast biopsy, starting keto diet and being restarted on her PPI for heartburn.  The patient will be switched from omeprazole to [place] that she has been given samples of this.  She also has crampy abdominal pain after eating except when she eats chicken and potatoes.  The patient will be given a prescription for dicyclomine to be taken 3 times a day.  She will also be set up for a EGD and colonoscopy due to her history of polyps and GERD with nausea.  The patient has been told that having gallstones does not mean she is having a gallbladder problem and that 25% of the general public have gallstones.   She has also been told that if her gallbladder is not the problem her symptoms will not improve.  She has also been encouraged to start a probiotic.  The patient has been explained the plan agrees with it.    Lucilla Lame, MD. [facility]al    Note: This dictation was prepared with Dragon dictation along with smaller phrase technology. Any transcriptional errors that result from this process are unintentional.

## 2020-03-31 ENCOUNTER — Encounter: Payer: Self-pay | Admitting: Gastroenterology

## 2020-03-31 ENCOUNTER — Other Ambulatory Visit: Payer: Self-pay

## 2020-04-01 ENCOUNTER — Other Ambulatory Visit: Payer: Self-pay

## 2020-04-01 ENCOUNTER — Other Ambulatory Visit
Admission: RE | Admit: 2020-04-01 | Discharge: 2020-04-01 | Disposition: A | Discharge status: Home / Self Care | Payer: BC Managed Care – PPO | Source: Ambulatory Visit | Attending: Gastroenterology | Admitting: Gastroenterology

## 2020-04-01 DIAGNOSIS — Z01812 Encounter for preprocedural laboratory examination: Secondary | ICD-10-CM | POA: Insufficient documentation

## 2020-04-01 DIAGNOSIS — Z20822 Contact with and (suspected) exposure to covid-19: Secondary | ICD-10-CM | POA: Diagnosis not present

## 2020-04-01 LAB — SARS CORONAVIRUS 2 (TAT 6-24 HRS): SARS Coronavirus 2: NEGATIVE

## 2020-04-02 ENCOUNTER — Other Ambulatory Visit: Payer: Self-pay

## 2020-04-02 MED ORDER — SUTAB 1479-225-188 MG PO TABS: 376.0000 mg | ORAL_TABLET | ORAL | 0 refills | Status: AC

## 2020-04-03 ENCOUNTER — Ambulatory Visit: Payer: BC Managed Care – PPO | Admitting: Anesthesiology

## 2020-04-03 ENCOUNTER — Ambulatory Visit
Admission: RE | Admit: 2020-04-03 | Discharge: 2020-04-03 | Disposition: A | Discharge status: Home / Self Care | Payer: BC Managed Care – PPO | Attending: Gastroenterology | Admitting: Gastroenterology

## 2020-04-03 ENCOUNTER — Other Ambulatory Visit: Payer: Self-pay

## 2020-04-03 ENCOUNTER — Encounter: Payer: Self-pay | Admitting: Gastroenterology

## 2020-04-03 ENCOUNTER — Encounter
Admission: RE | Disposition: A | Discharge status: Home / Self Care | Payer: Self-pay | Source: Home / Self Care | Attending: Gastroenterology

## 2020-04-03 DIAGNOSIS — Z79890 Hormone replacement therapy (postmenopausal): Secondary | ICD-10-CM | POA: Insufficient documentation

## 2020-04-03 DIAGNOSIS — F419 Anxiety disorder, unspecified: Secondary | ICD-10-CM | POA: Insufficient documentation

## 2020-04-03 DIAGNOSIS — Z79899 Other long term (current) drug therapy: Secondary | ICD-10-CM | POA: Diagnosis not present

## 2020-04-03 DIAGNOSIS — K5730 Diverticulosis of large intestine without perforation or abscess without bleeding: Secondary | ICD-10-CM | POA: Diagnosis not present

## 2020-04-03 DIAGNOSIS — Z7951 Long term (current) use of inhaled steroids: Secondary | ICD-10-CM | POA: Diagnosis not present

## 2020-04-03 DIAGNOSIS — K635 Polyp of colon: Secondary | ICD-10-CM

## 2020-04-03 DIAGNOSIS — D125 Benign neoplasm of sigmoid colon: Secondary | ICD-10-CM | POA: Insufficient documentation

## 2020-04-03 DIAGNOSIS — R12 Heartburn: Secondary | ICD-10-CM | POA: Insufficient documentation

## 2020-04-03 DIAGNOSIS — E039 Hypothyroidism, unspecified: Secondary | ICD-10-CM | POA: Insufficient documentation

## 2020-04-03 DIAGNOSIS — F329 Major depressive disorder, single episode, unspecified: Secondary | ICD-10-CM | POA: Diagnosis not present

## 2020-04-03 DIAGNOSIS — K2970 Gastritis, unspecified, without bleeding: Secondary | ICD-10-CM | POA: Diagnosis not present

## 2020-04-03 DIAGNOSIS — K529 Noninfective gastroenteritis and colitis, unspecified: Secondary | ICD-10-CM | POA: Diagnosis not present

## 2020-04-03 DIAGNOSIS — D124 Benign neoplasm of descending colon: Secondary | ICD-10-CM | POA: Diagnosis not present

## 2020-04-03 DIAGNOSIS — K219 Gastro-esophageal reflux disease without esophagitis: Secondary | ICD-10-CM

## 2020-04-03 DIAGNOSIS — J449 Chronic obstructive pulmonary disease, unspecified: Secondary | ICD-10-CM | POA: Diagnosis not present

## 2020-04-03 DIAGNOSIS — R197 Diarrhea, unspecified: Secondary | ICD-10-CM | POA: Diagnosis not present

## 2020-04-03 DIAGNOSIS — M8580 Other specified disorders of bone density and structure, unspecified site: Secondary | ICD-10-CM | POA: Insufficient documentation

## 2020-04-03 DIAGNOSIS — Z8719 Personal history of other diseases of the digestive system: Secondary | ICD-10-CM | POA: Diagnosis not present

## 2020-04-03 HISTORY — DX: Dizziness and giddiness: R42

## 2020-04-03 HISTORY — DX: Motion sickness, initial encounter: T75.3XXA

## 2020-04-03 HISTORY — PX: COLONOSCOPY WITH PROPOFOL: SHX5780

## 2020-04-03 HISTORY — PX: POLYPECTOMY: SHX5525

## 2020-04-03 HISTORY — DX: Other specified postprocedural states: Z98.890

## 2020-04-03 HISTORY — PX: ESOPHAGOGASTRODUODENOSCOPY (EGD) WITH PROPOFOL: SHX5813

## 2020-04-03 HISTORY — DX: Hypothyroidism, unspecified: E03.9

## 2020-04-03 HISTORY — DX: Other specified postprocedural states: R11.2

## 2020-04-03 SURGERY — COLONOSCOPY WITH PROPOFOL
Anesthesia: General | Site: Rectum

## 2020-04-03 MED ORDER — LIDOCAINE HCL (CARDIAC) PF 100 MG/5ML IV SOSY
PREFILLED_SYRINGE | INTRAVENOUS | Status: DC | PRN
  Administered 2020-04-03: 30 mg via INTRAVENOUS

## 2020-04-03 MED ORDER — LACTATED RINGERS IV SOLN
10.0000 mL/h | INTRAVENOUS | Status: DC
  Administered 2020-04-03: 10 mL/h via INTRAVENOUS

## 2020-04-03 MED ORDER — LACTATED RINGERS IV SOLN: INTRAVENOUS | Status: DC

## 2020-04-03 MED ORDER — STERILE WATER FOR IRRIGATION IR SOLN
Status: DC | PRN
  Administered 2020-04-03: 50 mL
  Administered 2020-04-03: 25 mL

## 2020-04-03 MED ORDER — SODIUM CHLORIDE 0.9 % IV SOLN: INTRAVENOUS | Status: DC

## 2020-04-03 MED ORDER — PROPOFOL 10 MG/ML IV BOLUS
INTRAVENOUS | Status: DC | PRN
  Administered 2020-04-03: 50 mg via INTRAVENOUS
  Administered 2020-04-03: 40 mg via INTRAVENOUS
  Administered 2020-04-03: 150 mg via INTRAVENOUS
  Administered 2020-04-03 (×2): 40 mg via INTRAVENOUS

## 2020-04-03 MED ORDER — ACETAMINOPHEN 325 MG PO TABS: 325.0000 mg | ORAL_TABLET | Freq: Once | ORAL | Status: DC

## 2020-04-03 MED ORDER — ACETAMINOPHEN 160 MG/5ML PO SOLN: 325.0000 mg | Freq: Once | ORAL | Status: DC

## 2020-04-03 SURGICAL SUPPLY — 9 items
BLOCK BITE 60FR ADLT L/F GRN (MISCELLANEOUS) ×4 IMPLANT
FORCEPS BIOP RAD 4 LRG CAP 4 (CUTTING FORCEPS) ×4 IMPLANT
GOWN CVR UNV OPN BCK APRN NK (MISCELLANEOUS) ×4 IMPLANT
GOWN ISOL THUMB LOOP REG UNIV (MISCELLANEOUS) ×4
KIT ENDO PROCEDURE OLY (KITS) ×4 IMPLANT
MANIFOLD NEPTUNE II (INSTRUMENTS) ×4 IMPLANT
SNARE SHORT THROW 13M SML OVAL (MISCELLANEOUS) ×4 IMPLANT
TRAP ETRAP POLY (MISCELLANEOUS) ×4 IMPLANT
WATER STERILE IRR 250ML POUR (IV SOLUTION) ×4 IMPLANT

## 2020-04-03 NOTE — Anesthesia Procedure Notes (Signed)
Performed by: Danuta Huseman, CRNA Pre-anesthesia Checklist: Patient identified, Emergency Drugs available, Suction available, Timeout performed and Patient being monitored Patient Re-evaluated:Patient Re-evaluated prior to induction Oxygen Delivery Method: Nasal cannula Placement Confirmation: positive ETCO2       

## 2020-04-03 NOTE — Interval H&P Note (Signed)
History and Physical Interval Note:  04/03/2020 7:50 AM  Cheyenne Nash  has presented today for surgery, with the diagnosis of Diarrhea R19.7 Nausea R11.0 Epigastric pain R10.13.  The various methods of treatment have been discussed with the patient and family. After consideration of risks, benefits and other options for treatment, the patient has consented to  Procedure(s) with comments: COLONOSCOPY WITH PROPOFOL (N/A) - 3 ESOPHAGOGASTRODUODENOSCOPY (EGD) WITH PROPOFOL (N/A) - 3 as a surgical intervention.  The patient's history has been reviewed, patient examined, no change in status, stable for surgery.  I have reviewed the patient's chart and labs.  Questions were answered to the patient's satisfaction.     Justyn Langham [employer]

## 2020-04-03 NOTE — Anesthesia Postprocedure Evaluation (Signed)
Anesthesia Post Note  Patient: Cheyenne Nash  Procedure(s) Performed: COLONOSCOPY WITH BIOPSY (N/A Rectum) ESOPHAGOGASTRODUODENOSCOPY (EGD) WITH BIOPSY (N/A Mouth) POLYPECTOMY (N/A Rectum)     Patient location during evaluation: PACU Anesthesia Type: General Level of consciousness: awake and alert and oriented Pain management: satisfactory to patient Vital Signs Assessment: post-procedure vital signs reviewed and stable Respiratory status: spontaneous breathing, nonlabored ventilation and respiratory function stable Cardiovascular status: blood pressure returned to baseline and stable Postop Assessment: Adequate PO intake and No signs of nausea or vomiting Anesthetic complications: no    Raliegh Ip

## 2020-04-03 NOTE — Anesthesia Preprocedure Evaluation (Signed)
Anesthesia Evaluation  Patient identified by MRN, date of birth, ID band Patient awake    Reviewed: Allergy & Precautions, H&P , NPO status , Patient's Chart, lab work & pertinent test results  History of Anesthesia Complications (+) PONV and history of anesthetic complications  Airway Mallampati: II  TM Distance: >3 FB Neck ROM: full    Dental no notable dental hx.    Pulmonary COPD,    Pulmonary exam normal breath sounds clear to auscultation       Cardiovascular Normal cardiovascular exam Rhythm:regular Rate:Normal     Neuro/Psych    GI/Hepatic GERD  ,  Endo/Other  Hypothyroidism   Renal/GU      Musculoskeletal   Abdominal   Peds  Hematology   Anesthesia Other Findings   Reproductive/Obstetrics                             Anesthesia Physical Anesthesia Plan  ASA: II  Anesthesia Plan: General   Post-op Pain Management:    Induction: Intravenous  PONV Risk Score and Plan: 4 or greater and Treatment may vary due to age or medical condition, TIVA and Propofol infusion  Airway Management Planned: Natural Airway  Additional Equipment:   Intra-op Plan:   Post-operative Plan:   Informed Consent: I have reviewed the patients History and Physical, chart, labs and discussed the procedure including the risks, benefits and alternatives for the proposed anesthesia with the patient or authorized representative who has indicated his/her understanding and acceptance.     Dental Advisory Given  Plan Discussed with: CRNA  Anesthesia Plan Comments:         Anesthesia Quick Evaluation

## 2020-04-03 NOTE — Op Note (Signed)
[facility] Gastroenterology Patient Name: Rowynn Kullberg Procedure Date: 04/03/2020 8:26 AM MRN: [id number] Account #: [id number] Date of Birth: [birth year] Admit Type: Outpatient Age: 54 Room: [facility] OR ROOM 01 Gender: Female Note Status: Finalized Procedure:             Colonoscopy Indications:           Chronic diarrhea Providers:             Lucilla Lame MD, MD Medicines:             Propofol per Anesthesia Complications:         No immediate complications. Procedure:             Pre-Anesthesia Assessment:                        - Prior to the procedure, a History and Physical was                         performed, and patient medications and allergies were                         reviewed. The patient's tolerance of previous                         anesthesia was also reviewed. The risks and benefits                         of the procedure and the sedation options and risks                         were discussed with the patient. All questions were                         answered, and informed consent was obtained. Prior                         Anticoagulants: The patient has taken no previous                         anticoagulant or antiplatelet agents. ASA Grade                         Assessment: II - A patient with mild systemic disease.                         After reviewing the risks and benefits, the patient                         was deemed in satisfactory condition to undergo the                         procedure.                        After obtaining informed consent, the colonoscope was                         passed under direct vision. Throughout the procedure,  the patient's blood pressure, pulse, and oxygen                         saturations were monitored continuously. The                         Colonoscope was introduced through the anus and                         advanced to the the terminal ileum. The colonoscopy                         was performed without difficulty. The patient                         tolerated the procedure well. The quality of the bowel                         preparation was excellent. Findings:      The perianal and digital rectal examinations were normal.      The terminal ileum appeared normal. Biopsies were taken with a cold       forceps for histology.      Random biopsies were obtained with cold forceps for histology randomly       in the entire colon.      Three sessile polyps were found in the descending colon. The polyps were       5 to 8 mm in size. These polyps were removed with a cold snare.       Resection and retrieval were complete.      A 7 mm polyp was found in the sigmoid colon. The polyp was pedunculated.       The polyp was removed with a cold snare. Resection and retrieval were       complete.      Multiple small-mouthed diverticula were found in the sigmoid colon. Impression:            - The examined portion of the ileum was normal.                         Biopsied.                        - Three 5 to 8 mm polyps in the descending colon,                         removed with a cold snare. Resected and retrieved.                        - One 7 mm polyp in the sigmoid colon, removed with a                         cold snare. Resected and retrieved.                        - Diverticulosis in the sigmoid colon.                        - Random biopsies were obtained in the entire colon. Recommendation:        -  Discharge patient to home.                        - Resume previous diet.                        - Continue present medications.                        - Await pathology results.                        - Repeat colonoscopy in 5 years if polyp adenoma and                         10 years if hyperplastic Procedure Code(s):     --- Professional ---                        [phone], Colonoscopy, flexible; with removal of                         tumor(s),  polyp(s), or other lesion(s) by snare                         technique                        45380, 44, Colonoscopy, flexible; with biopsy, single                         or multiple Diagnosis Code(s):     --- Professional ---                        K52.9, Noninfective gastroenteritis and colitis,                         unspecified                        K63.5, Polyp of colon CPT copyright 2019 American Medical Association. All rights reserved. The codes documented in this report are preliminary and upon coder review may  be revised to meet current compliance requirements. Lucilla Lame MD, MD 04/03/2020 8:58:42 AM This report has been signed electronically. Number of Addenda: 0 Note Initiated On: 04/03/2020 8:26 AM Scope Withdrawal Time: 0 hours 10 minutes 11 seconds  Total Procedure Duration: 0 hours 17 minutes 6 seconds  Estimated Blood Loss:  Estimated blood loss: none.      [facility]

## 2020-04-03 NOTE — Op Note (Signed)
[facility] Gastroenterology Patient Name: Cheyenne Nash Procedure Date: 04/03/2020 8:26 AM MRN: [id number] Account #: [id number] Date of Birth: 1966-04-07 Admit Type: Outpatient Age: 54 Room: [facility] OR ROOM 01 Gender: Female Note Status: Finalized Procedure:             Upper GI endoscopy Indications:           Heartburn Providers:             Lucilla Lame MD, MD Referring MD:          Leonie Douglas. Doy Hutching, MD (Referring MD) Medicines:             Propofol per Anesthesia Complications:         No immediate complications. Procedure:             Pre-Anesthesia Assessment:                        - Prior to the procedure, a History and Physical was                         performed, and patient medications and allergies were                         reviewed. The patient's tolerance of previous                         anesthesia was also reviewed. The risks and benefits                         of the procedure and the sedation options and risks                         were discussed with the patient. All questions were                         answered, and informed consent was obtained. Prior                         Anticoagulants: The patient has taken no previous                         anticoagulant or antiplatelet agents. ASA Grade                         Assessment: II - A patient with mild systemic disease.                         After reviewing the risks and benefits, the patient                         was deemed in satisfactory condition to undergo the                         procedure.                        After obtaining informed consent, the endoscope was  passed under direct vision. Throughout the procedure,                         the patient's blood pressure, pulse, and oxygen                         saturations were monitored continuously. The was                         introduced through the mouth, and advanced to the             second part of duodenum. The upper GI endoscopy was                         accomplished without difficulty. The patient tolerated                         the procedure well. Findings:      The examined esophagus was normal.      Diffuse moderate inflammation characterized by erythema was found in the       gastric antrum. Biopsies were taken with a cold forceps for histology.      The examined duodenum was normal. Biopsies were taken with a cold       forceps for histology. Impression:            - Normal esophagus.                        - Gastritis. Biopsied.                        - Normal examined duodenum. Biopsied. Recommendation:        - Discharge patient to home.                        - Resume previous diet.                        - Continue present medications.                        - Await pathology results.                        - Perform a colonoscopy today. Procedure Code(s):     --- Professional ---                        [phone], Esophagogastroduodenoscopy, flexible,                         transoral; with biopsy, single or multiple Diagnosis Code(s):     --- Professional ---                        R12, Heartburn                        K29.70, Gastritis, unspecified, without bleeding CPT copyright 2019 American Medical Association. All rights reserved. The codes documented in this report are preliminary and upon coder review may  be revised to meet current compliance requirements. Lucilla Lame MD, MD 04/03/2020 8:37:07  AM This report has been signed electronically. Number of Addenda: 0 Note Initiated On: 04/03/2020 8:26 AM Total Procedure Duration: 0 hours 3 minutes 35 seconds  Estimated Blood Loss:  Estimated blood loss: none.      [facility]

## 2020-04-03 NOTE — Transfer of Care (Signed)
Immediate Anesthesia Transfer of Care Note  Patient: Cheyenne Nash  Procedure(s) Performed: COLONOSCOPY WITH BIOPSY (N/A Rectum) ESOPHAGOGASTRODUODENOSCOPY (EGD) WITH BIOPSY (N/A Mouth) POLYPECTOMY (N/A Rectum)  Patient Location: PACU  Anesthesia Type: General  Level of Consciousness: awake, alert  and patient cooperative  Airway and Oxygen Therapy: Patient Spontanous Breathing and Patient connected to supplemental oxygen  Post-op Assessment: Post-op Vital signs reviewed, Patient's Cardiovascular Status Stable, Respiratory Function Stable, Patent Airway and No signs of Nausea or vomiting  Post-op Vital Signs: Reviewed and stable  Complications: No apparent anesthesia complications

## 2020-04-04 ENCOUNTER — Encounter: Payer: Self-pay | Admitting: *Deleted

## 2020-04-07 LAB — SURGICAL PATHOLOGY

## 2020-04-09 ENCOUNTER — Inpatient Hospital Stay
Admission: RE | Admit: 2020-04-09 | Discharge: 2020-04-09 | Disposition: A | Discharge status: Home / Self Care | Payer: Self-pay | Source: Ambulatory Visit | Attending: *Deleted | Admitting: *Deleted

## 2020-04-09 ENCOUNTER — Other Ambulatory Visit: Payer: Self-pay | Admitting: *Deleted

## 2020-04-09 ENCOUNTER — Telehealth: Payer: Self-pay

## 2020-04-09 DIAGNOSIS — Z1231 Encounter for screening mammogram for malignant neoplasm of breast: Secondary | ICD-10-CM

## 2020-04-09 NOTE — Telephone Encounter (Signed)
-----   Message from Lucilla Lame, MD sent at 04/08/2020  7:03 PM EDT ----- Patient know she had multiple polyps that were precancerous and needs another colonoscopy in 5 years.  The biopsies of the stomach showed healing inflammation but the random biopsies of the small bowel and the colon did not show a cause for her diarrhea.  If she is continuing to have diarrhea please have her come in the office to discuss further treatment.

## 2020-04-09 NOTE — Telephone Encounter (Signed)
Pt notified of result via mychart

## 2020-04-23 ENCOUNTER — Ambulatory Visit: Payer: BC Managed Care – PPO | Admitting: Gastroenterology

## 2020-05-08 ENCOUNTER — Other Ambulatory Visit: Payer: Self-pay | Admitting: Internal Medicine

## 2020-05-08 DIAGNOSIS — N6092 Unspecified benign mammary dysplasia of left breast: Secondary | ICD-10-CM

## 2020-05-30 ENCOUNTER — Ambulatory Visit
Admission: RE | Admit: 2020-05-30 | Discharge: 2020-05-30 | Disposition: A | Discharge status: Home / Self Care | Payer: BC Managed Care – PPO | Source: Ambulatory Visit | Attending: Internal Medicine | Admitting: Internal Medicine

## 2020-05-30 DIAGNOSIS — N6092 Unspecified benign mammary dysplasia of left breast: Secondary | ICD-10-CM | POA: Insufficient documentation

## 2020-06-30 ENCOUNTER — Ambulatory Visit: Payer: BC Managed Care – PPO | Admitting: Gastroenterology

## 2020-06-30 ENCOUNTER — Other Ambulatory Visit: Payer: Self-pay

## 2020-06-30 DIAGNOSIS — R112 Nausea with vomiting, unspecified: Secondary | ICD-10-CM | POA: Insufficient documentation

## 2020-06-30 DIAGNOSIS — Z9889 Other specified postprocedural states: Secondary | ICD-10-CM | POA: Insufficient documentation

## 2020-06-30 DIAGNOSIS — D049 Carcinoma in situ of skin, unspecified: Secondary | ICD-10-CM | POA: Insufficient documentation

## 2020-08-25 ENCOUNTER — Ambulatory Visit: Payer: BC Managed Care – PPO | Admitting: Gastroenterology

## 2020-10-21 ENCOUNTER — Ambulatory Visit
Admission: RE | Admit: 2020-10-21 | Discharge: 2020-10-21 | Disposition: A | Discharge status: Home / Self Care | Payer: BC Managed Care – PPO | Source: Ambulatory Visit | Attending: Family Medicine | Admitting: Family Medicine

## 2020-10-21 ENCOUNTER — Ambulatory Visit (INDEPENDENT_AMBULATORY_CARE_PROVIDER_SITE_OTHER): Payer: BC Managed Care – PPO

## 2020-10-21 ENCOUNTER — Other Ambulatory Visit: Payer: Self-pay

## 2020-10-21 VITALS — BP 131/85 | HR 82 | Temp 98.8°F | Resp 16

## 2020-10-21 DIAGNOSIS — R0602 Shortness of breath: Secondary | ICD-10-CM | POA: Diagnosis not present

## 2020-10-21 DIAGNOSIS — R059 Cough, unspecified: Secondary | ICD-10-CM | POA: Diagnosis not present

## 2020-10-21 DIAGNOSIS — J189 Pneumonia, unspecified organism: Secondary | ICD-10-CM

## 2020-10-21 MED ORDER — HYDROCODONE-HOMATROPINE 5-1.5 MG/5ML PO SYRP: 5.0000 mL | ORAL_SOLUTION | Freq: Four times a day (QID) | ORAL | 0 refills | Status: DC | PRN

## 2020-10-21 MED ORDER — AMOXICILLIN-POT CLAVULANATE 875-125 MG PO TABS
1.0000 | ORAL_TABLET | Freq: Two times a day (BID) | ORAL | 0 refills | Status: DC
Start: 2020-10-21 — End: 2020-11-21

## 2020-10-21 NOTE — ED Triage Notes (Addendum)
Patient c/o scratchy throat, productive cough w "greenish" sputum, headache, body aches, and fever since yesterday.   Patient endorses fever (102F) at home.   Patient took Tylenol and Advil with relief of fever.   Patient took an at home COVID-19 test w/ a negative result.   History of Asthma.

## 2020-10-21 NOTE — Discharge Instructions (Addendum)
Treating you for pneumonia based on your x-ray.  Take the medication as prescribed.  Cough medication as needed. Follow up as needed for continued or worsening symptoms

## 2020-10-22 NOTE — ED Provider Notes (Signed)
Roderic Palau    CSN: [id number] Arrival date & time: 10/21/20  1037      History   Chief Complaint Chief Complaint  Patient presents with  . Cough  . Headache    HPI Cheyenne Nash is a 54 y.o. female.   Patient is a 54 year old female past medical history of allergic rhinitis, anxiety, asthma, COPD, degenerative disc disease, GERD, hypothyroidism, mitral regurgitation, vertigo.  She presents today with complaint of scratchy throat, productive cough with greenish sputum, headache, body aches and fever since yesterday.  Reporting fever of 102 at home.  Took Tylenol and Advil with relief of her fever.  Took an at home Covid test which was negative.     Past Medical History:  Diagnosis Date  . Allergic rhinitis   . Anxiety and depression   . Asthma   . COPD (chronic obstructive pulmonary disease) ([place])   . Degenerative disc disease, cervical   . Fatigue   . GERD (gastroesophageal reflux disease)   . Heart murmur   . Hypothyroidism   . Lung infection   . Meningitis    h/o recd some cns and chemo infusion  . Menopausal syndrome   . Menopause   . Mitral regurgitation   . Motion sickness    cars  . Osteopenia   . PONV (postoperative nausea and vomiting)   . Tachycardia   . Vertigo    1 episode, several yrs ago    Patient Active Problem List   Diagnosis Date Noted  . Basal cell carcinoma (BCC) in situ of skin 06/30/2020  . PONV (postoperative nausea and vomiting) 06/30/2020  . Diarrhea   . Polyp of descending colon   . Atypical ductal hyperplasia of left breast 09/16/2019  . Arthritis of carpometacarpal [facility]) joint of right thumb 08/31/2018  . Vitamin D deficiency 06/09/2018  . Pain in right hand 11/16/2017  . Thyroid disease 02/22/2017  . Allergic rhinitis 01/19/2016  . Asthma without status asthmaticus 01/19/2016  . Degeneration of intervertebral disc of cervical region 01/19/2016  . Acid reflux 01/19/2016  . Cephalalgia 01/19/2016  . Climacteric  01/19/2016  . Osteopenia 01/19/2016  . Tachycardia 12/19/2015  . Asthma, moderate persistent 12/19/2015  . Anxiety and depression 09/23/2014  . Pure hypercholesterolemia 09/23/2014    Past Surgical History:  Procedure Laterality Date  . ABDOMINAL [place]STERECTOMY     [place]  . ARTHROSCOPIC REPAIR ACL     LEFT  . BREAST BIOPSY Left 10/03/2019   affirm stereo bx of calcs, x marker, ADH  . BREAST LUMPECTOMY Left 11/2019   done at [facility]  . CARDIAC CATHETERIZATION  04/08/2016   [facility]  . COLONOSCOPY WITH PROPOFOL N/A 04/03/2020   Procedure: COLONOSCOPY WITH BIOPSY;  Surgeon: Lucilla Lame, MD;  Location: [place];  Service: Endoscopy;  Laterality: N/A;  3  . CYSTECTOMY Right   . DILATION AND CURETTAGE OF UTERUS    . ESOPHAGOGASTRODUODENOSCOPY (EGD) WITH PROPOFOL N/A 04/03/2020   Procedure: ESOPHAGOGASTRODUODENOSCOPY (EGD) WITH BIOPSY;  Surgeon: Lucilla Lame, MD;  Location: [place];  Service: Endoscopy;  Laterality: N/A;  3  . KNEE SURGERY     LEFT   . LAPAROSCOPY     lysis of adhesion  . LEFT OOPHORECTOMY    . POLYPECTOMY N/A 04/03/2020   Procedure: POLYPECTOMY;  Surgeon: Lucilla Lame, MD;  Location: [place];  Service: Endoscopy;  Laterality: N/A;  . SHOULDER SURGERY     RIGHT    OB History  Gravida  2   Para  2   Term  2   Preterm      AB      Living  2     SAB      TAB      Ectopic      Multiple      Live Births  2            Home Medications    Prior to Admission medications   Medication Sig Start Date End Date Taking? Authorizing Provider  Cholecalciferol (VITAMIN D3) 1.25 MG (50000 UT) TABS Take by mouth once a week.   Yes [provider]  estradiol (ESTRACE) 1 MG tablet Take 1 mg by mouth daily. 05/15/20  Yes [provider]  levothyroxine (SYNTHROID, LEVOTHROID) 75 MCG tablet Take 75 mcg by mouth daily before breakfast.   Yes [provider]  montelukast (SINGULAIR) 10 MG tablet Take 10 mg  by mouth at bedtime.   Yes [provider]  omeprazole (PRILOSEC) 40 MG capsule Take 40 mg by mouth daily. 06/22/20  Yes [provider]  Vitamin D, Ergocalciferol, (DRISDOL) 1.25 MG (50000 UNIT) CAPS capsule Take 50,000 Units by mouth once a week. 05/15/20  Yes [provider]  amoxicillin-clavulanate (AUGMENTIN) 875-125 MG tablet Take 1 tablet by mouth every 12 (twelve) hours. 10/21/20   Loura Halt A, NP  dicyclomine (BENTYL) 10 MG capsule Take 1 capsule (10 mg total) by mouth 4 (four) times daily -  before meals and at bedtime. Patient not taking: Reported on 03/31/2020 03/27/20   Lucilla Lame, MD  EPINEPHrine 0.3 mg/0.3 mL IJ SOAJ injection Inject into the muscle. 05/27/20   [provider]  fluticasone (FLONASE) 50 MCG/ACT nasal spray Place 2 sprays into the nose daily.  10/06/15 03/31/20  [provider]  Fluticasone-Salmeterol (ADVAIR) 250-50 MCG/DOSE AEPB Inhale 1 puff into the lungs daily.    [provider]  HYDROcodone-homatropine (HYCODAN) 5-1.5 MG/5ML syrup Take 5 mLs by mouth every 6 (six) hours as needed for cough. 10/21/20   Orvan July, NP  ondansetron (ZOFRAN-ODT) 4 MG disintegrating tablet  11/22/19   [provider]  Sodium Sulfate-Mag Sulfate-KCl (SUTAB) 325-200-1401 MG TABS Take 376 mg by mouth as directed. 04/02/20   Lucilla Lame, MD  azelastine (ASTELIN) 0.1 % nasal spray Place 2 sprays into the nose 2 (two) times daily.  10/06/15 10/13/19  [provider]  cetirizine (ZYRTEC) 10 MG tablet Take 10 mg by mouth daily.   10/13/19  [provider]  dexlansoprazole (DEXILANT) 60 MG capsule Take by mouth. 01/19/16 10/13/19  [provider]  DULoxetine (CYMBALTA) 30 MG capsule 1 capsule qd for 2 weeks, then increase to 2 capsules once a day. 07/25/15 10/13/19  [provider]  levalbuterol (XOPENEX HFA) 45 MCG/ACT inhaler Inhale 1 puff into the lungs every 4 (four) hours as needed for  wheezing. Patient not taking: Reported on 11/12/2016 12/19/15 10/13/19  Minna Merritts, MD  phentermine (ADIPEX-P) 37.5 MG tablet Take 1 tablet (37.5 mg total) by mouth daily before breakfast. 11/12/16 10/13/19  Joylene Igo, CNM  traZODone (DESYREL) 100 MG tablet Reported on 01/27/2016 12/15/15 10/13/19  [provider]  VENTOLIN HFA 108 (90 Base) MCG/ACT inhaler Inhale 1-2 puffs into the lungs every 4 (four) hours as needed.  12/18/15 10/13/19  [provider]    Family History Family History  Problem Relation Age of Onset  . Hypertension Mother   . Hypertension  Father   . Heart disease Father 64       CABG  . Heart attack Father   . Depression Maternal Grandmother   . Cancer Neg Hx   . Breast cancer Neg Hx     Social History Social History   Tobacco Use  . Smoking status: Never Smoker  . Smokeless tobacco: Never Used  Vaping Use  . Vaping Use: Never used  Substance Use Topics  . Alcohol use: Yes    Comment: 2-3 beers per month  . Drug use: No     Allergies   Sulfa antibiotics   Review of Systems Review of Systems   Physical Exam Triage Vital Signs ED Triage Vitals  Enc Vitals Group     BP 10/21/20 1052 131/85     Pulse Rate 10/21/20 1052 82     Resp 10/21/20 1052 16     Temp 10/21/20 1052 98.8 F (37.1 C)     Temp Source 10/21/20 1052 Oral     SpO2 10/21/20 1052 96 %     Weight --      Height --      Head Circumference --      Peak Flow --      Pain Score 10/21/20 1050 8     Pain Loc --      Pain Edu? --      Excl. in [place]? --    No data found.  Updated Vital Signs BP 131/85 (BP Location: Left Arm)   Pulse 82   Temp 98.8 F (37.1 C) (Oral)   Resp 16   SpO2 96%   Visual Acuity Right Eye Distance:   Left Eye Distance:   Bilateral Distance:    Right Eye Near:   Left Eye Near:    Bilateral Near:     Physical Exam Vitals and nursing note reviewed.  Constitutional:      General: She is not in acute distress.     Appearance: Normal appearance. She is not ill-appearing, toxic-appearing or diaphoretic.  HENT:     Head: Normocephalic.     Right Ear: Tympanic membrane and ear canal normal.     Left Ear: Tympanic membrane and ear canal normal.     Nose: Nose normal.     Mouth/Throat:     Pharynx: Oropharynx is clear.  Eyes:     Conjunctiva/sclera: Conjunctivae normal.  Cardiovascular:     Rate and Rhythm: Normal rate and regular rhythm.  Pulmonary:     Effort: Pulmonary effort is normal.     Breath sounds: Rhonchi present.     Comments: Rhonchi and wheezing  to right middle/lower lobe. Musculoskeletal:        General: Normal range of motion.     Cervical back: Normal range of motion.  Skin:    General: Skin is warm and dry.     Findings: No rash.  Neurological:     Mental Status: She is alert.  Psychiatric:        Mood and Affect: Mood normal.      UC Treatments / Results  Labs (all labs ordered are listed, but only abnormal results are displayed) Labs Reviewed  COVID-19, FLU A+B AND RSV    EKG   Radiology DG Chest 2 View  Result Date: 10/21/2020 CLINICAL DATA:  Cough, shortness of breath, fever EXAM: CHEST - 2 VIEW COMPARISON:  None. FINDINGS: Airspace opacity anteriorly on the lateral view, not well visualized on the frontal view. This could  be within the lingula or right middle lobe. No effusions. Heart is normal size. No acute bony abnormality. IMPRESSION: Airspace opacity anteriorly at the lung bases on the lateral view which could reflect right middle lobe or lingular infiltrate. Not well visualized on the frontal view. Electronically Signed   By: Rolm Baptise M.D.   On: 10/21/2020 11:40    Procedures Procedures (including critical care time)  Medications Ordered in UC Medications - No data to display  Initial Impression / Assessment and Plan / UC Course  I have reviewed the triage vital signs and the nursing notes.  Pertinent labs & imaging results that were available  during my care of the patient were reviewed by me and considered in my medical decision making (see chart for details).     Right middle lobe pneumonia Treating with Augmentin at this time.  Hycodan cough syrup as needed. Follow up as needed for continued or worsening symptoms  Final Clinical Impressions(s) / UC Diagnoses   Final diagnoses:  Cough  Pneumonia of right middle lobe due to infectious organism     Discharge Instructions     Treating you for pneumonia based on your x-ray.  Take the medication as prescribed.  Cough medication as needed. Follow up as needed for continued or worsening symptoms     ED Prescriptions    Medication Sig Dispense Auth. Provider   amoxicillin-clavulanate (AUGMENTIN) 875-125 MG tablet Take 1 tablet by mouth every 12 (twelve) hours. 14 tablet Aailyah Dunbar A, NP   HYDROcodone-homatropine (HYCODAN) 5-1.5 MG/5ML syrup Take 5 mLs by mouth every 6 (six) hours as needed for cough. 120 mL Anshika Pethtel A, NP     PDMP not reviewed this encounter.   Orvan July, NP 10/22/20 [phone]

## 2020-10-23 LAB — COVID-19, FLU A+B AND RSV
Influenza A, NAA: NOT DETECTED
Influenza B, NAA: NOT DETECTED
RSV, NAA: NOT DETECTED
SARS-CoV-2, NAA: NOT DETECTED

## 2020-11-21 ENCOUNTER — Encounter: Payer: Self-pay | Admitting: Obstetrics and Gynecology

## 2020-11-21 ENCOUNTER — Other Ambulatory Visit: Payer: Self-pay

## 2020-11-21 ENCOUNTER — Ambulatory Visit (INDEPENDENT_AMBULATORY_CARE_PROVIDER_SITE_OTHER): Payer: BC Managed Care – PPO | Admitting: Obstetrics and Gynecology

## 2020-11-21 VITALS — BP 113/77 | HR 80 | Ht 62.0 in | Wt 154.4 lb

## 2020-11-21 DIAGNOSIS — N951 Menopausal and female climacteric states: Secondary | ICD-10-CM | POA: Diagnosis not present

## 2020-11-21 DIAGNOSIS — Z01419 Encounter for gynecological examination (general) (routine) without abnormal findings: Secondary | ICD-10-CM | POA: Diagnosis not present

## 2020-11-21 MED ORDER — ESTRADIOL 1 MG PO TABS: 1.0000 mg | ORAL_TABLET | Freq: Every day | ORAL | 3 refills | Status: DC

## 2020-11-21 NOTE — Progress Notes (Signed)
HPI:      [name] is a 55 y.o. [phone] who LMP was No LMP recorded. Patient has had a hysterectomy.  Subjective:   She presents today for her annual examination.  She has previously attempted to wean from estrogen replacement therapy and had significant hot flashes.  She initially tried to wean because she had a lumpectomy last year and had some concerns regarding the possibility of breast cancer.  Her lumpectomy did not show breast cancer. She would like to continue on estrogen because of her severe hot flashes. She has no other complaints. She is fully vaccinated.    Hx: The following portions of the patient's history were reviewed and updated as appropriate:             She  has a past medical history of Allergic rhinitis, Anxiety and depression, Asthma, COPD (chronic obstructive pulmonary disease) ([place]), Degenerative disc disease, cervical, Fatigue, GERD (gastroesophageal reflux disease), Heart murmur, Hypothyroidism, Lung infection, Meningitis, Menopausal syndrome, Menopause, Mitral regurgitation, Motion sickness, Osteopenia, PONV (postoperative nausea and vomiting), Tachycardia, and Vertigo. She does not have any pertinent problems on file. She  has a past surgical history that includes Shoulder surgery; Knee surgery; Arthroscopic repair ACL; Abdominal hysterectomy; Left oophorectomy; laparoscopy; Dilation and curettage of uterus; Cystectomy (Right); Cardiac catheterization (04/08/2016); Colonoscopy with propofol (N/A, 04/03/2020); Esophagogastroduodenoscopy (egd) with propofol (N/A, 04/03/2020); polypectomy (N/A, 04/03/2020); Breast lumpectomy (Left, 11/2019); and Breast biopsy (Left, 10/03/2019). Her family history includes Depression in her maternal grandmother; Heart attack in her father; Heart disease (age of onset: 7) in her father; Hypertension in her father and mother. She  reports that she has never smoked. She has never used smokeless tobacco. She reports current alcohol use.  She reports that she does not use drugs. She has a current medication list which includes the following prescription(s): epinephrine, estradiol, fluticasone-salmeterol, levothyroxine, montelukast, omeprazole, vitamin d (ergocalciferol), vitamin d3, fluticasone, ondansetron, sutab, [DISCONTINUED] azelastine, [DISCONTINUED] cetirizine, [DISCONTINUED] dexlansoprazole, [DISCONTINUED] duloxetine, [DISCONTINUED] levalbuterol, [DISCONTINUED] phentermine, [DISCONTINUED] trazodone, and [DISCONTINUED] ventolin hfa. She is allergic to sulfa antibiotics.       Review of Systems:  Review of Systems  Constitutional: Denied constitutional symptoms, night sweats, recent illness, fatigue, fever, insomnia and weight loss.  Eyes: Denied eye symptoms, eye pain, photophobia, vision change and visual disturbance.  Ears/Nose/Throat/Neck: Denied ear, nose, throat or neck symptoms, hearing loss, nasal discharge, sinus congestion and sore throat.  Cardiovascular: Denied cardiovascular symptoms, arrhythmia, chest pain/pressure, edema, exercise intolerance, orthopnea and palpitations.  Respiratory: Denied pulmonary symptoms, asthma, pleuritic pain, productive sputum, cough, dyspnea and wheezing.  Gastrointestinal: Denied, gastro-esophageal reflux, melena, nausea and vomiting.  Genitourinary: Denied genitourinary symptoms including symptomatic vaginal discharge, pelvic relaxation issues, and urinary complaints.  Musculoskeletal: Denied musculoskeletal symptoms, stiffness, swelling, muscle weakness and myalgia.  Dermatologic: Denied dermatology symptoms, rash and scar.  Neurologic: Denied neurology symptoms, dizziness, headache, neck pain and syncope.  Psychiatric: Denied psychiatric symptoms, anxiety and depression.  Endocrine: Denied endocrine symptoms including hot flashes and night sweats.   Meds:   Current Outpatient Medications on File Prior to Visit  Medication Sig Dispense Refill  . EPINEPHrine 0.3 mg/0.3 mL IJ  SOAJ injection Inject into the muscle.    . Fluticasone-Salmeterol (ADVAIR) 250-50 MCG/DOSE AEPB Inhale 1 puff into the lungs daily.    Marland Kitchen levothyroxine (SYNTHROID, LEVOTHROID) 75 MCG tablet Take 75 mcg by mouth daily before breakfast.    . montelukast (SINGULAIR) 10 MG tablet Take 10 mg by mouth at bedtime.    Marland Kitchen  omeprazole (PRILOSEC) 40 MG capsule Take 40 mg by mouth daily.    . Vitamin D, Ergocalciferol, (DRISDOL) 1.25 MG (50000 UNIT) CAPS capsule Take 50,000 Units by mouth once a week.    . Cholecalciferol (VITAMIN D3) 1.25 MG (50000 UT) TABS Take by mouth once a week.    . fluticasone (FLONASE) 50 MCG/ACT nasal spray Place 2 sprays into the nose daily.     . ondansetron (ZOFRAN-ODT) 4 MG disintegrating tablet  (Patient not taking: Reported on 11/21/2020)    . Sodium Sulfate-Mag Sulfate-KCl (SUTAB) [phone] MG TABS Take 376 mg by mouth as directed. (Patient not taking: Reported on 11/21/2020) 24 tablet 0  . [DISCONTINUED] azelastine (ASTELIN) 0.1 % nasal spray Place 2 sprays into the nose 2 (two) times daily.     . [DISCONTINUED] cetirizine (ZYRTEC) 10 MG tablet Take 10 mg by mouth daily.     . [DISCONTINUED] dexlansoprazole (DEXILANT) 60 MG capsule Take by mouth.    . [DISCONTINUED] DULoxetine (CYMBALTA) 30 MG capsule 1 capsule qd for 2 weeks, then increase to 2 capsules once a day.    . [DISCONTINUED] levalbuterol (XOPENEX HFA) 45 MCG/ACT inhaler Inhale 1 puff into the lungs every 4 (four) hours as needed for wheezing. (Patient not taking: Reported on 11/12/2016) 1 Inhaler 12  . [DISCONTINUED] phentermine (ADIPEX-P) 37.5 MG tablet Take 1 tablet (37.5 mg total) by mouth daily before breakfast. 30 tablet 2  . [DISCONTINUED] traZODone (DESYREL) 100 MG tablet Reported on 01/27/2016    . [DISCONTINUED] VENTOLIN HFA 108 (90 Base) MCG/ACT inhaler Inhale 1-2 puffs into the lungs every 4 (four) hours as needed.   10   No current facility-administered medications on file prior to visit.            Objective:     Vitals:   11/21/20 0808  BP: 113/77  Pulse: 80    Filed Weights   11/21/20 0808  Weight: 154 lb 6.4 oz (70 kg)              Physical examination General NAD, Conversant  HEENT Atraumatic; Op clear with mmm.  Normo-cephalic. Pupils reactive. Anicteric sclerae  Thyroid/Neck Smooth without nodularity or enlargement. Normal ROM.  Neck Supple.  Skin No rashes, lesions or ulceration. Normal palpated skin turgor. No nodularity.  Breasts: No masses or discharge.  Symmetric.  No axillary adenopathy.  Lungs: Clear to auscultation.No rales or wheezes. Normal Respiratory effort, no retractions.  Heart: NSR.  No murmurs or rubs appreciated. No periferal edema  Abdomen: Soft.  Non-tender.  No masses.  No HSM. No hernia  Extremities: Moves all appropriately.  Normal ROM for age. No lymphadenopathy.  Neuro: Oriented to PPT.  Normal mood. Normal affect.     Pelvic:   Vulva: Normal appearance.  No lesions.   Vagina: No lesions or abnormalities noted.  Support: Normal pelvic support.  Urethra No masses tenderness or scarring.  Meatus Normal size without lesions or prolapse.  Cervix: Surgically absent   Anus: Normal exam.  No lesions.  Perineum: Normal exam.  No lesions.        Bimanual   Uterus: Surgically absent   Adnexae: No masses.  Non-tender to palpation.  Cul-de-sac: Negative for abnormality.     Assessment:    [id number] Patient Active Problem List   Diagnosis Date Noted  . Basal cell carcinoma (BCC) in situ of skin 06/30/2020  . PONV (postoperative nausea and vomiting) 06/30/2020  . Diarrhea   . Polyp of descending colon   .  Atypical ductal hyperplasia of left breast 09/16/2019  . Arthritis of carpometacarpal [facility]) joint of right thumb 08/31/2018  . Vitamin D deficiency 06/09/2018  . Pain in right hand 11/16/2017  . Thyroid disease 02/22/2017  . Allergic rhinitis 01/19/2016  . Asthma without status asthmaticus 01/19/2016  . Degeneration of  intervertebral disc of cervical region 01/19/2016  . Acid reflux 01/19/2016  . Cephalalgia 01/19/2016  . Climacteric 01/19/2016  . Osteopenia 01/19/2016  . Tachycardia 12/19/2015  . Asthma, moderate persistent 12/19/2015  . Anxiety and depression 09/23/2014  . Pure hypercholesterolemia 09/23/2014     1. Well woman exam with routine gynecological exam   2. Symptomatic menopausal or female climacteric states        Plan:            1.  Basic Screening Recommendations The basic screening recommendations for asymptomatic women were discussed with the patient during her visit.  The age-appropriate recommendations were discussed with her and the rational for the tests reviewed.  When I am informed by the patient that another primary care physician has previously obtained the age-appropriate tests and they are up-to-date, only outstanding tests are ordered and referrals given as necessary.  Abnormal results of tests will be discussed with her when all of her results are completed.  Routine preventative health maintenance measures emphasized: Exercise/Diet/Weight control, Tobacco Warnings, Alcohol/Substance use risks and Stress Management 2.  After long discussion regarding ERT risks and benefits.  Patient has elected to stay on ERT. Orders No orders of the defined types were placed in this encounter.    Meds ordered this encounter  Medications  . estradiol (ESTRACE) 1 MG tablet    Sig: Take 1 tablet (1 mg total) by mouth daily.    Dispense:  90 tablet    Refill:  3            F/U  No follow-ups on file.  Finis Bud, M.D. 11/21/2020 8:33 AM

## 2020-12-31 IMAGING — DX DG CHEST 2V
2 series · 2 of 2 positions shown · non-contrast
Comparison: None.

CLINICAL DATA: Cough, shortness of breath, fever

EXAM:
CHEST - 2 VIEW

[chest pa]
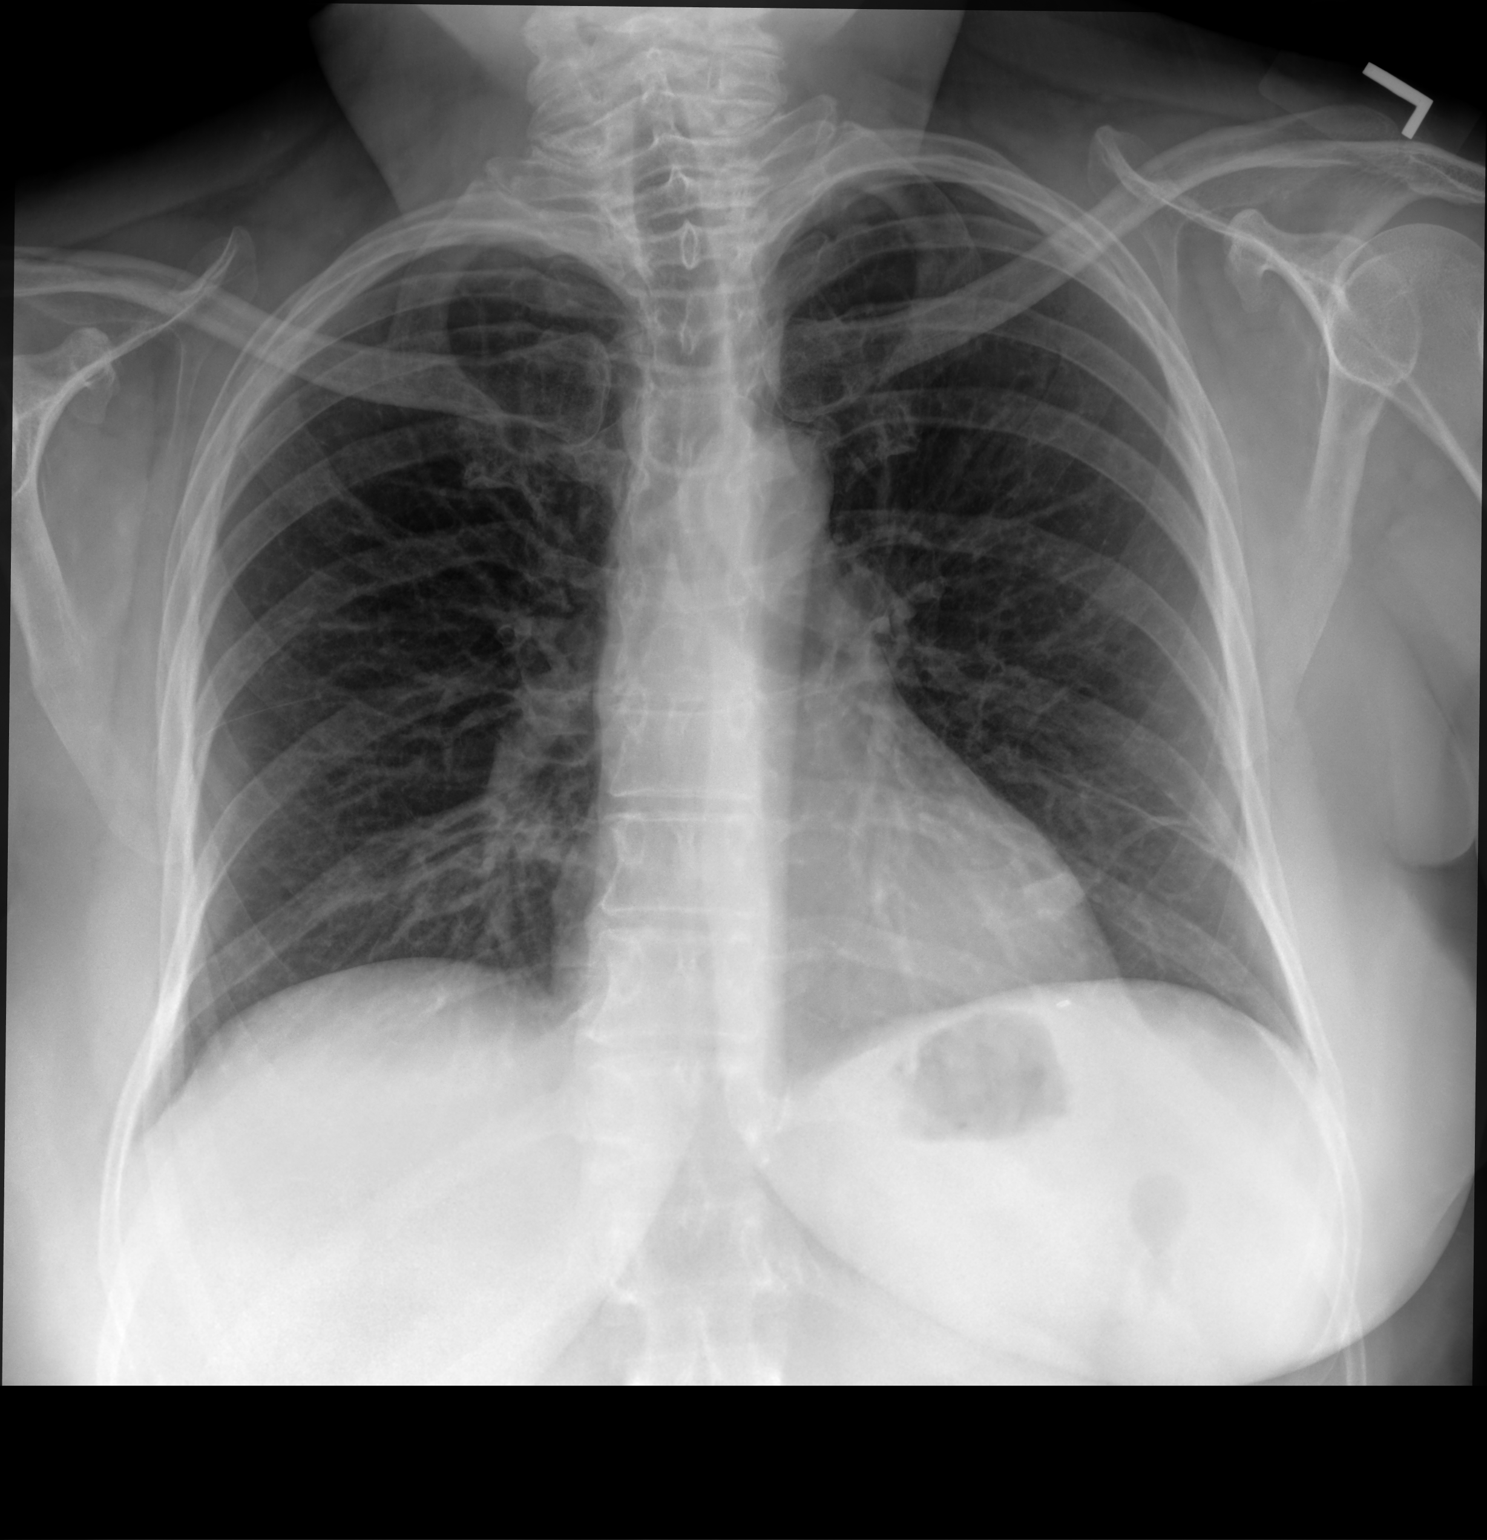

[chest lat]
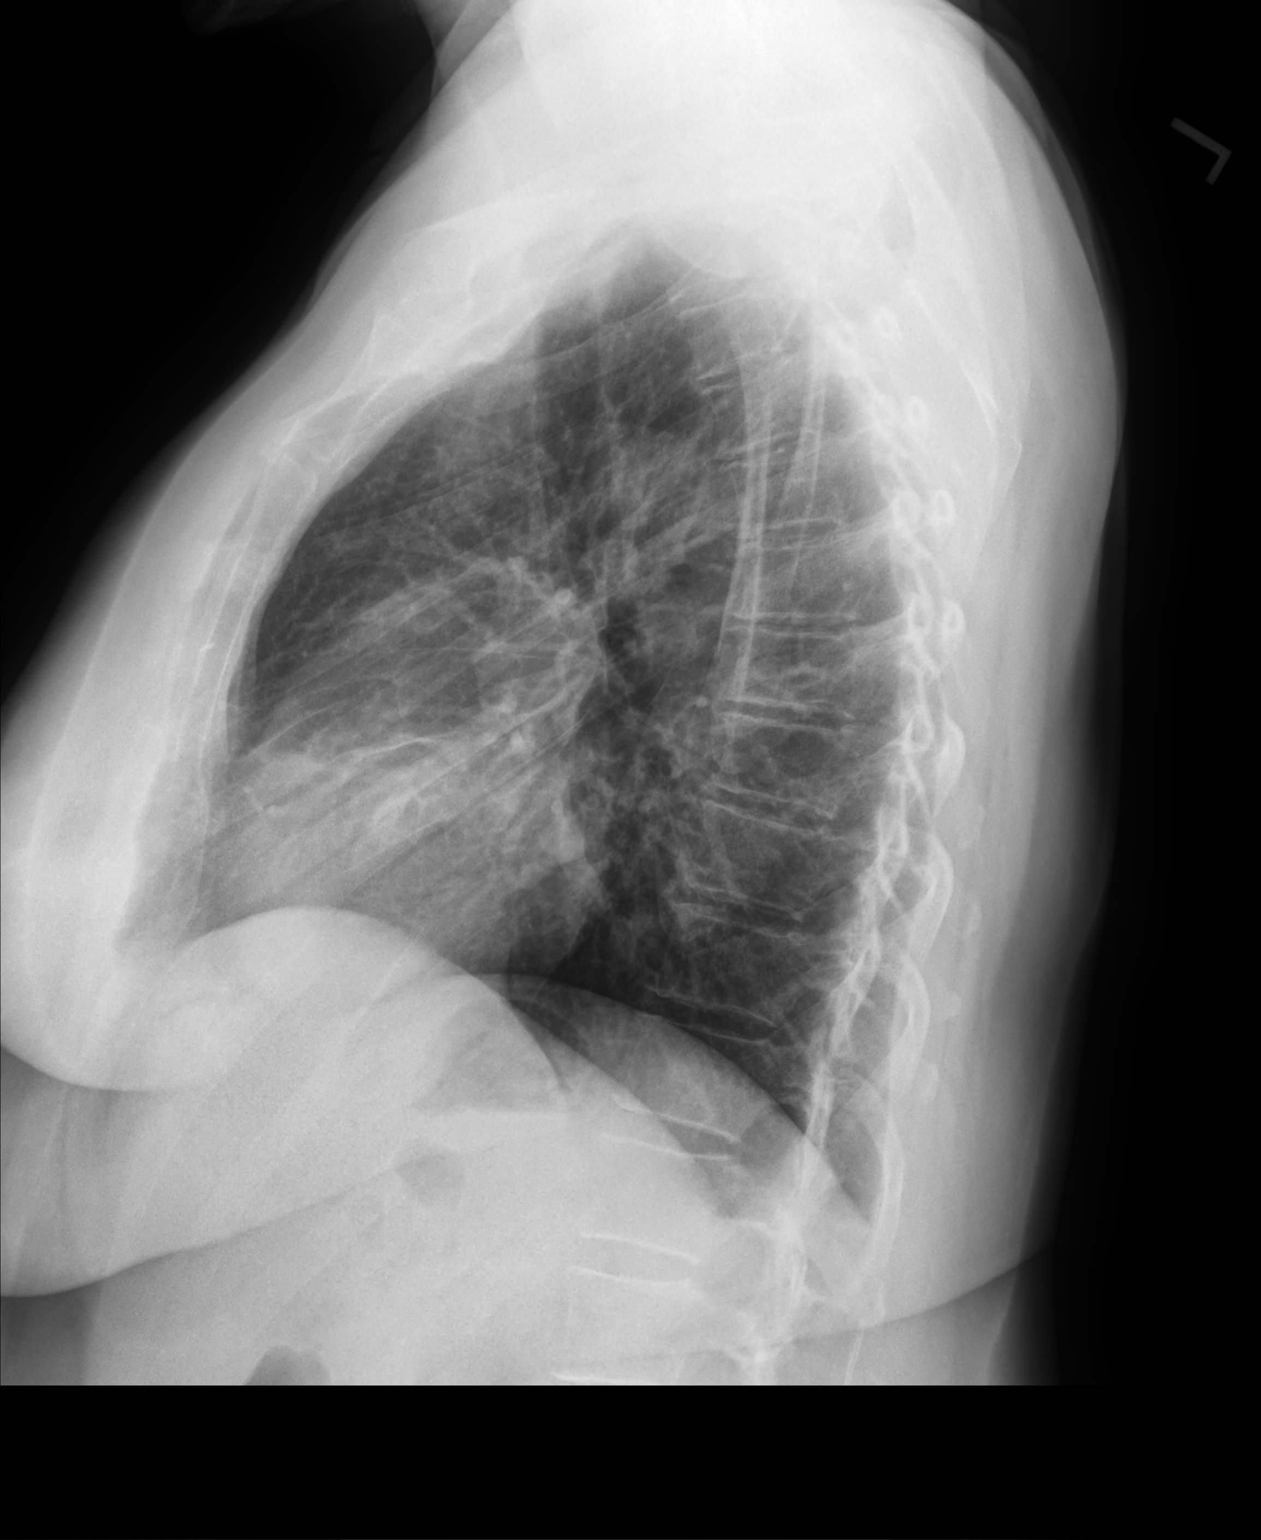

[2 of 2 positions shown; findings below may reference images not displayed]

FINDINGS: Airspace opacity anteriorly on the lateral view, not well visualized
on the frontal view. This could be within the lingula or right
middle lobe. No effusions. Heart is normal size. No acute bony
abnormality.
IMPRESSION: Airspace opacity anteriorly at the lung bases on the lateral view
which could reflect right middle lobe or lingular infiltrate. Not
well visualized on the frontal view.

## 2021-07-30 ENCOUNTER — Other Ambulatory Visit: Payer: Self-pay | Admitting: Internal Medicine

## 2021-07-30 DIAGNOSIS — Z1231 Encounter for screening mammogram for malignant neoplasm of breast: Secondary | ICD-10-CM

## 2021-08-11 ENCOUNTER — Ambulatory Visit
Admission: RE | Admit: 2021-08-11 | Discharge: 2021-08-11 | Disposition: A | Discharge status: Home / Self Care | Payer: BC Managed Care – PPO | Source: Ambulatory Visit | Attending: Physician Assistant | Admitting: Physician Assistant

## 2021-08-11 ENCOUNTER — Other Ambulatory Visit: Payer: Self-pay | Admitting: Physician Assistant

## 2021-08-11 ENCOUNTER — Other Ambulatory Visit: Payer: Self-pay

## 2021-08-11 DIAGNOSIS — R1011 Right upper quadrant pain: Secondary | ICD-10-CM

## 2021-08-11 DIAGNOSIS — R110 Nausea: Secondary | ICD-10-CM

## 2021-08-11 DIAGNOSIS — R7989 Other specified abnormal findings of blood chemistry: Secondary | ICD-10-CM

## 2021-08-12 ENCOUNTER — Ambulatory Visit
Admission: RE | Admit: 2021-08-12 | Discharge: 2021-08-12 | Disposition: A | Discharge status: Home / Self Care | Payer: BC Managed Care – PPO | Source: Ambulatory Visit | Attending: Physician Assistant | Admitting: Physician Assistant

## 2021-08-12 ENCOUNTER — Other Ambulatory Visit: Payer: Self-pay | Admitting: Physician Assistant

## 2021-08-12 DIAGNOSIS — R1011 Right upper quadrant pain: Secondary | ICD-10-CM

## 2021-08-12 DIAGNOSIS — R7989 Other specified abnormal findings of blood chemistry: Secondary | ICD-10-CM

## 2021-08-12 MED ORDER — GADOBUTROL 1 MMOL/ML IV SOLN
7.0000 mL | Freq: Once | INTRAVENOUS | Status: AC | PRN
  Administered 2021-08-12: 7 mL via INTRAVENOUS

## 2021-10-01 ENCOUNTER — Ambulatory Visit
Admission: RE | Admit: 2021-10-01 | Discharge: 2021-10-01 | Disposition: A | Discharge status: Home / Self Care | Payer: BC Managed Care – PPO | Source: Ambulatory Visit | Attending: Internal Medicine | Admitting: Internal Medicine

## 2021-10-01 ENCOUNTER — Other Ambulatory Visit: Payer: Self-pay

## 2021-10-01 DIAGNOSIS — Z1231 Encounter for screening mammogram for malignant neoplasm of breast: Secondary | ICD-10-CM | POA: Diagnosis present

## 2021-10-06 ENCOUNTER — Other Ambulatory Visit: Payer: Self-pay | Admitting: Internal Medicine

## 2021-10-06 DIAGNOSIS — R921 Mammographic calcification found on diagnostic imaging of breast: Secondary | ICD-10-CM

## 2021-10-06 DIAGNOSIS — R928 Other abnormal and inconclusive findings on diagnostic imaging of breast: Secondary | ICD-10-CM

## 2021-10-06 DIAGNOSIS — N6489 Other specified disorders of breast: Secondary | ICD-10-CM

## 2021-10-13 ENCOUNTER — Ambulatory Visit
Admission: RE | Admit: 2021-10-13 | Discharge: 2021-10-13 | Disposition: A | Discharge status: Home / Self Care | Payer: BC Managed Care – PPO | Source: Ambulatory Visit | Attending: Internal Medicine | Admitting: Internal Medicine

## 2021-10-13 ENCOUNTER — Other Ambulatory Visit: Payer: Self-pay

## 2021-10-13 DIAGNOSIS — R921 Mammographic calcification found on diagnostic imaging of breast: Secondary | ICD-10-CM

## 2021-10-13 DIAGNOSIS — R928 Other abnormal and inconclusive findings on diagnostic imaging of breast: Secondary | ICD-10-CM

## 2021-10-13 DIAGNOSIS — N6489 Other specified disorders of breast: Secondary | ICD-10-CM

## 2021-10-15 ENCOUNTER — Other Ambulatory Visit: Payer: Self-pay | Admitting: Internal Medicine

## 2021-10-15 DIAGNOSIS — R921 Mammographic calcification found on diagnostic imaging of breast: Secondary | ICD-10-CM

## 2021-10-15 DIAGNOSIS — R928 Other abnormal and inconclusive findings on diagnostic imaging of breast: Secondary | ICD-10-CM

## 2021-10-27 ENCOUNTER — Ambulatory Visit
Admission: RE | Admit: 2021-10-27 | Discharge: 2021-10-27 | Disposition: A | Discharge status: Home / Self Care | Payer: BC Managed Care – PPO | Source: Ambulatory Visit | Attending: Internal Medicine | Admitting: Internal Medicine

## 2021-10-27 ENCOUNTER — Ambulatory Visit: Payer: BC Managed Care – PPO

## 2021-10-27 ENCOUNTER — Other Ambulatory Visit: Payer: Self-pay

## 2021-10-27 DIAGNOSIS — R928 Other abnormal and inconclusive findings on diagnostic imaging of breast: Secondary | ICD-10-CM | POA: Insufficient documentation

## 2021-10-27 DIAGNOSIS — R921 Mammographic calcification found on diagnostic imaging of breast: Secondary | ICD-10-CM | POA: Diagnosis present

## 2021-10-27 HISTORY — PX: BREAST BIOPSY: SHX20

## 2021-10-28 ENCOUNTER — Encounter: Payer: Self-pay | Admitting: *Deleted

## 2021-10-28 LAB — SURGICAL PATHOLOGY

## 2021-10-28 NOTE — Progress Notes (Signed)
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  I have called the patient.  States she has surgery at [facility] in the past, but would like to stay local.  I have scheduled her an appointment with Dr. Peyton Najjar on 10/30/21 @ 8:30.  Will have Judeen Hammans, Dr. Deniece Ree nurse give patient educational  "My Breast Cancer Treatment Handbook" by Josephine Igo, RN.

## 2021-10-29 ENCOUNTER — Ambulatory Visit: Payer: BC Managed Care – PPO

## 2021-11-02 ENCOUNTER — Encounter: Payer: Self-pay | Admitting: *Deleted

## 2021-11-02 NOTE — Progress Notes (Signed)
Patient called and states she loves Dr. Peyton Najjar, but her family wants her to go back to see Dr. Marilynn Rail at [facility], whom she has seen in the past.  States she is working on getting that appointment.

## 2021-11-04 ENCOUNTER — Encounter: Payer: Self-pay | Admitting: Occupational Therapy

## 2021-11-04 ENCOUNTER — Ambulatory Visit: Payer: BC Managed Care – PPO | Attending: Rheumatology | Admitting: Occupational Therapy

## 2021-11-04 DIAGNOSIS — R278 Other lack of coordination: Secondary | ICD-10-CM | POA: Diagnosis present

## 2021-11-04 DIAGNOSIS — M79641 Pain in right hand: Secondary | ICD-10-CM | POA: Insufficient documentation

## 2021-11-04 DIAGNOSIS — M79642 Pain in left hand: Secondary | ICD-10-CM | POA: Diagnosis present

## 2021-11-04 DIAGNOSIS — M6281 Muscle weakness (generalized): Secondary | ICD-10-CM | POA: Insufficient documentation

## 2021-11-04 DIAGNOSIS — M25631 Stiffness of right wrist, not elsewhere classified: Secondary | ICD-10-CM | POA: Diagnosis present

## 2021-11-04 DIAGNOSIS — M25632 Stiffness of left wrist, not elsewhere classified: Secondary | ICD-10-CM | POA: Diagnosis present

## 2021-11-10 ENCOUNTER — Ambulatory Visit: Payer: BC Managed Care – PPO | Admitting: Occupational Therapy

## 2021-11-10 DIAGNOSIS — M79642 Pain in left hand: Secondary | ICD-10-CM

## 2021-11-10 DIAGNOSIS — M25631 Stiffness of right wrist, not elsewhere classified: Secondary | ICD-10-CM

## 2021-11-10 DIAGNOSIS — M79641 Pain in right hand: Secondary | ICD-10-CM | POA: Diagnosis not present

## 2021-11-10 DIAGNOSIS — M25632 Stiffness of left wrist, not elsewhere classified: Secondary | ICD-10-CM

## 2021-11-10 DIAGNOSIS — R278 Other lack of coordination: Secondary | ICD-10-CM

## 2021-11-10 DIAGNOSIS — M6281 Muscle weakness (generalized): Secondary | ICD-10-CM

## 2021-11-11 ENCOUNTER — Other Ambulatory Visit: Payer: Self-pay | Admitting: Obstetrics and Gynecology

## 2021-11-11 DIAGNOSIS — N951 Menopausal and female climacteric states: Secondary | ICD-10-CM

## 2021-11-12 ENCOUNTER — Ambulatory Visit: Payer: BC Managed Care – PPO | Admitting: Occupational Therapy

## 2021-11-12 DIAGNOSIS — M79642 Pain in left hand: Secondary | ICD-10-CM

## 2021-11-12 DIAGNOSIS — M79641 Pain in right hand: Secondary | ICD-10-CM | POA: Diagnosis not present

## 2021-11-12 DIAGNOSIS — M25632 Stiffness of left wrist, not elsewhere classified: Secondary | ICD-10-CM

## 2021-11-12 DIAGNOSIS — M25631 Stiffness of right wrist, not elsewhere classified: Secondary | ICD-10-CM

## 2021-11-12 DIAGNOSIS — M6281 Muscle weakness (generalized): Secondary | ICD-10-CM

## 2021-11-12 DIAGNOSIS — R278 Other lack of coordination: Secondary | ICD-10-CM

## 2021-11-14 ENCOUNTER — Encounter: Payer: Self-pay | Admitting: Occupational Therapy

## 2021-11-14 NOTE — Therapy (Signed)
[facility] [address], [place], [address] Phone: [phone]   Fax:  [phone]  Occupational Therapy Treatment  Patient Details  Name: Cheyenne Nash MRN: [id number] Date of Birth: Jun 15, 1966 Referring Provider (OT): Dr. Merita Norton   Encounter Date: 11/12/2021   OT End of Session - 11/14/21 1256     Visit Number 3    Number of Visits 12    Date for OT Re-Evaluation 12/16/21    OT Start Time 1345    OT Stop Time 1440    OT Time Calculation (min) 55 min    Activity Tolerance Patient tolerated treatment well    Behavior During Therapy [facility] for tasks assessed/performed             Past Medical History:  Diagnosis Date   Allergic rhinitis    Anxiety and depression    Asthma    COPD (chronic obstructive pulmonary disease) ([facility])    Degenerative disc disease, cervical    Fatigue    GERD (gastroesophageal reflux disease)    Heart murmur    Hypothyroidism    Lung infection    Meningitis    h/o recd some cns and chemo infusion   Menopausal syndrome    Menopause    Mitral regurgitation    Motion sickness    cars   Osteopenia    PONV (postoperative nausea and vomiting)    Tachycardia    Vertigo    1 episode, several yrs ago    Past Surgical History:  Procedure Laterality Date   ABDOMINAL HYSTERECTOMY     [place] lso   ARTHROSCOPIC REPAIR ACL     LEFT   BREAST BIOPSY Left 10/03/2019   affirm stereo bx of calcs, x marker, ADH   BREAST BIOPSY Left 10/27/2021   stereo bx, coil clip, path pending   BREAST LUMPECTOMY Left 11/2019   done at [place]  04/08/2016   [facility]   COLONOSCOPY WITH PROPOFOL N/A 04/03/2020   Procedure: COLONOSCOPY WITH BIOPSY;  Surgeon: Lucilla Lame, MD;  Location: [place];  Service: Endoscopy;  Laterality: N/A;  3   CYSTECTOMY Right    DILATION AND CURETTAGE OF UTERUS     ESOPHAGOGASTRODUODENOSCOPY (EGD) WITH PROPOFOL N/A 04/03/2020    Procedure: ESOPHAGOGASTRODUODENOSCOPY (EGD) WITH BIOPSY;  Surgeon: Lucilla Lame, MD;  Location: [place];  Service: Endoscopy;  Laterality: N/A;  3   KNEE SURGERY     LEFT    LAPAROSCOPY     lysis of adhesion   LEFT OOPHORECTOMY     POLYPECTOMY N/A 04/03/2020   Procedure: POLYPECTOMY;  Surgeon: Lucilla Lame, MD;  Location: [place];  Service: Endoscopy;  Laterality: N/A;   SHOULDER SURGERY     RIGHT    There were no vitals filed for this visit.   Subjective Assessment - 11/14/21 1253     Subjective  Pt reports yesterday was a bad day and reports she had difficulty getting herself dressed.  "If I had to be here yesterday I would have had to cancel."  She feels better today and was able to drive herself to therapy.  She received her paraffin unit for home but the wax is scented and not enough to really get her hand and wrist covered.    Pertinent History Pt is a 55 yo female who reports decline in health over the past year, COVID,  had gall bladder surgery, diagnosed with RA  with recent severe flare and also now diagnosed with breast cancer.  She is waiting for surgery for her breast cancer before she proceeding with infusions for RA flare.  She reports not responding to Humira.  Referred for OT for evaluation and treatment with use of heat therapy.    Patient Stated Goals Pt would like to be able to use her hands again, improve functional use.    Currently in Pain? Yes    Pain Score 4     Pain Location Hand    Pain Orientation Left;Right    Pain Descriptors / Indicators Aching    Pain Type Chronic pain    Pain Onset More than a month ago    Pain Frequency Intermittent             Pt educated on joint protection principles with physical examples of larger grip cooking utensils, looped scissors, jar openers Issued and instructed on handout to outline additional items as well as joint protection principles specifically for the kitchen.  Pt able to demonstrate use  of looped scissors to cut paper effectively, preferred those over spring loaded scissors also tried in the clinic.    Paraffin bath to bilateral UEs to decrease pain, increase ROM and increase tissue mobility, 8 mins   Therapeutic Exercise: Pt seen for tendon gliding exercises to increase ROM of bilateral UEs.   Radial and palmar ABD of thumb Oppositional movements of thumb to each digit, difficulty to the base of the small finger.   Composite thumb movement circumduction Wrist movements flexion/extension, radial and ulnar deviation Supination/pronation All exercises performed 10-15 reps for 1-2 sets.  Response to tx: Pt has continued to make good progress, decreased pain in bilateral hand and wrist with implementation of use of heat, exercises and joint protection principles.  Pt in the process of ordering more paraffin for her unit so she will be able to use at home.  Issued info on places to purchase unscented paraffin.  Pt requested therapist to measure grip strength again, right 20#, left 22#.  Continue with goals towards plan of care to improve bilateral hand function after RA flare.                       OT Education - 11/14/21 1255     Education Details edema control, exercises, use of heat, joint protection principles    Person(s) Educated Patient    Methods Explanation;Demonstration;Handout    Comprehension Verbalized understanding;Returned demonstration;Need further instruction                 OT Long Term Goals - 11/14/21 1142       OT LONG TERM GOAL #1   Title Patient will be independent with home exercise program.    Baseline No current program    Time 3    Period Weeks    Status New    Target Date 11/25/21      OT LONG TERM GOAL #2   Title Patient will demonstrate understanding of joint protection principles and their application into her daily routine.    Baseline No current knowledge of joint protection principles at eval    Time 3     Period Weeks    Status New    Target Date 11/25/21      OT LONG TERM GOAL #3   Title Patient will complete basic self-care tasks with modified independence, using adaptive equipment or techniques as needed    Baseline Patient  requiring mod to max assist for self-care at eval    Time 6    Period Weeks    Status New    Target Date 12/16/21      OT LONG TERM GOAL #4   Title Patient will demonstrate improvement in digit range of motion to be able to make a composite fist for grasping and holding objects.    Baseline Patient limited with range of motion and unable to make composite fist at eval    Time 2    Period Weeks    Status New    Target Date 11/18/21      OT LONG TERM GOAL #5   Title Patient will complete light home management tasks with minimal assistance    Baseline Max assist at eval    Time 6    Period Weeks    Status New    Target Date 12/16/21      Long Term Additional Goals   Additional Long Term Goals Yes      OT LONG TERM GOAL #6   Title Patient will improve bilateral grip strength 20 pounds to be able to hold and manage ADL items for self-care completion    Baseline Patient unable to squeeze shampoo or soap from bottle at eval    Time 6    Period Weeks    Status New    Target Date 12/16/21                   Plan - 11/14/21 1319     Clinical Impression Statement Pt has continued to make good progress, decreased pain in bilateral hand and wrist with implementation of use of heat, exercises and joint protection principles.  Pt in the process of ordering more paraffin for her unit so she will be able to use at home.  Issued info on places to purchase unscented paraffin.  Pt requested therapist to measure grip strength again, right 20#, left 22#.  Continue with goals towards plan of care to improve bilateral hand function after RA flare.    OT Occupational Profile and History Problem Focused Assessment - Including review of records relating to presenting  problem    Occupational performance deficits (Please refer to evaluation for details): ADL's;Work;IADL's;Leisure;Social Participation;Rest and Sleep    Body Structure / Function / Physical Skills ADL;Dexterity;Flexibility;ROM;Strength;Coordination;Edema;FMC;IADL;Pain;UE functional use;Decreased knowledge of use of DME    Psychosocial Skills Coping Strategies;Environmental  Adaptations;Habits;Routines and Behaviors    Rehab Potential Good    Clinical Decision Making Several treatment options, min-mod task modification necessary    Comorbidities Affecting Occupational Performance: Presence of comorbidities impacting occupational performance    Comorbidities impacting occupational performance description: severe RA flare, joint pain all over body, breast cancer and awaiting surgery, decreased response to medications for RA    Modification or Assistance to Complete Evaluation  No modification of tasks or assist necessary to complete eval    OT Frequency 2x / week    OT Duration 6 weeks    OT Treatment/Interventions Self-care/ADL training;Paraffin;Therapeutic exercise;DME and/or AE instruction;Fluidtherapy;Neuromuscular education;Manual Therapy;Moist Heat;Contrast Bath;Therapeutic activities;Patient/family education    Consulted and Agree with Plan of Care Patient             Patient will benefit from skilled therapeutic intervention in order to improve the following deficits and impairments:   Body Structure / Function / Physical Skills: ADL, Dexterity, Flexibility, ROM, Strength, Coordination, Edema, FMC, IADL, Pain, UE functional use, Decreased knowledge of use of DME  Psychosocial Skills: Coping Strategies, Environmental  Adaptations, Habits, Routines and Behaviors   Visit Diagnosis: Pain in right hand  Pain in left hand  Muscle weakness (generalized)  Stiffness of left wrist, not elsewhere classified  Stiffness of right wrist, not elsewhere classified  Other lack of  coordination    Problem List Patient Active Problem List   Diagnosis Date Noted   Basal cell carcinoma (BCC) in situ of skin 06/30/2020   PONV (postoperative nausea and vomiting) 06/30/2020   Diarrhea    Polyp of descending colon    Atypical ductal hyperplasia of left breast 09/16/2019   Arthritis of carpometacarpal ([facility]) joint of right thumb 08/31/2018   Vitamin D deficiency 06/09/2018   Pain in right hand 11/16/2017   Thyroid disease 02/22/2017   Allergic rhinitis 01/19/2016   Asthma without status asthmaticus 01/19/2016   Degeneration of intervertebral disc of cervical region 01/19/2016   Acid reflux 01/19/2016   Cephalalgia 01/19/2016   Climacteric 01/19/2016   Osteopenia 01/19/2016   Tachycardia 12/19/2015   Asthma, moderate persistent 12/19/2015   Anxiety and depression 09/23/2014   Pure hypercholesterolemia 09/23/2014   Celinda Dethlefs T Bernardina Cacho, OTR/L, CLT  Niyla Marone, OT 11/14/2021, 1:30 PM  [facility] [address] [place], [address] Phone: [phone]   Fax:  [phone]  Name: Cheyenne Nash MRN: [id number] Date of Birth: November 13, 1966

## 2021-11-14 NOTE — Therapy (Addendum)
[facility] [address], [place], [address] Phone: [phone]   Fax:  [phone]  Occupational Therapy Treatment  Patient Details  Name: Cheyenne Nash MRN: [id number] Date of Birth: [birth year] Referring Provider (OT): Dr. Merita Norton   Encounter Date: 11/10/2021   OT End of Session - 11/14/21 1229     Visit Number 2    Number of Visits 12    Date for OT Re-Evaluation 12/16/21    OT Start Time 9381    OT Stop Time 1605    OT Time Calculation (min) 50 min    Activity Tolerance Patient tolerated treatment well    Behavior During Therapy WFL for tasks assessed/performed             Past Medical History:  Diagnosis Date   Allergic rhinitis    Anxiety and depression    Asthma    COPD (chronic obstructive pulmonary disease) (HCC)    Degenerative disc disease, cervical    Fatigue    GERD (gastroesophageal reflux disease)    Heart murmur    Hypothyroidism    Lung infection    Meningitis    h/o recd some cns and chemo infusion   Menopausal syndrome    Menopause    Mitral regurgitation    Motion sickness    cars   Osteopenia    PONV (postoperative nausea and vomiting)    Tachycardia    Vertigo    1 episode, several yrs ago    Past Surgical History:  Procedure Laterality Date   ABDOMINAL HYSTERECTOMY     [place] lso   ARTHROSCOPIC REPAIR ACL     LEFT   BREAST BIOPSY Left 10/03/2019   affirm stereo bx of calcs, x marker, ADH   BREAST BIOPSY Left 10/27/2021   stereo bx, coil clip, path pending   BREAST LUMPECTOMY Left 11/2019   done at [facility]  04/08/2016   [facility]   COLONOSCOPY WITH PROPOFOL N/A 04/03/2020   Procedure: COLONOSCOPY WITH BIOPSY;  Surgeon: Lucilla Lame, MD;  Location: [facility];  Service: Endoscopy;  Laterality: N/A;  3   CYSTECTOMY Right    DILATION AND CURETTAGE OF UTERUS     ESOPHAGOGASTRODUODENOSCOPY (EGD) WITH PROPOFOL N/A 04/03/2020    Procedure: ESOPHAGOGASTRODUODENOSCOPY (EGD) WITH BIOPSY;  Surgeon: Lucilla Lame, MD;  Location: [facility];  Service: Endoscopy;  Laterality: N/A;  3   KNEE SURGERY     LEFT    LAPAROSCOPY     lysis of adhesion   LEFT OOPHORECTOMY     POLYPECTOMY N/A 04/03/2020   Procedure: POLYPECTOMY;  Surgeon: Lucilla Lame, MD;  Location: [facility];  Service: Endoscopy;  Laterality: N/A;   SHOULDER SURGERY     RIGHT    There were no vitals filed for this visit.   Subjective Assessment - 11/14/21 1221     Subjective  Pt reports while she is still in pain all over, her hands have improved with use of contrast, heat and exercises since evaluation.    Pertinent History Pt is a 55 yo female who reports decline in health over the past year, COVID,  had gall bladder surgery, diagnosed with RA with recent severe flare and also now diagnosed with breast cancer.  She is waiting for surgery for her breast cancer before she proceeding with infusions for RA flare.  She reports not responding to Humira.  Referred for OT for evaluation and  treatment with use of heat therapy.    Patient Stated Goals Pt would like to be able to use her hands again, improve functional use.    Currently in Pain? Yes    Pain Score 5     Pain Location Hand    Pain Orientation Right;Left    Pain Descriptors / Indicators Aching;Sharp    Pain Type Chronic pain    Pain Onset More than a month ago    Pain Frequency Intermittent                      AROM   Right Forearm Pronation 75 Degrees    Right Forearm Supination 90 Degrees    Left Forearm Pronation 90 Degrees    Left Forearm Supination 95 Degrees    Right Wrist Extension 55 Degrees    Right Wrist Flexion 75 Degrees    Right Wrist Radial Deviation 18 Degrees    Right Wrist Ulnar Deviation 22 Degrees    Left Wrist Extension 55 Degrees    Left Wrist Flexion 80 Degrees    Left Wrist Radial Deviation 15 Degrees    Left Wrist Ulnar Deviation 18 Degrees       Strength   Right Hand Grip (lbs) 15    Right Hand Lateral Pinch 1 lbs    Right Hand 3 Point Pinch 2 lbs    Left Hand Grip (lbs) 20    Left Hand Lateral Pinch 7 lbs    Left Hand 3 Point Pinch 6 lbs        Paraffin bath to bilateral UEs to decrease pain, increase ROM and increase tissue mobility, 8 mins   Therapeutic Exercise: Additional measurements taken this date for wrist and hand, see above flowsheet for details.   Pt seen for tendon gliding exercises to increase ROM of bilateral UEs.   Radial and palmar ABD of thumb Oppositional movements of thumb to each digit, difficulty to the base of the small finger.   Composite thumb movement circumduction All exercises performed 10-15 reps Review of HEP  Pt instructed on general joint protection principles in relation to her self care and IADL tasks.   Response to tx: Pt demonstrating excellent progress with pain reduction and improved active movement in bilateral UEs.  She is pleased with her progress and reports feeling hopeful she will get better.  Pt's motion in hands improved, by the end of session she was able to demonstrate a gross composite fist motion.  Pt able to demonstrate understanding of home exercise program and is purchasing a home unit for paraffin to help manage her symptoms.  Continue to work towards goals to manage symptoms of RA flare, decrease pain and improve ROM of bilateral UEs for improved functional use for self care and IADL tasks.                   OT Education - 11/14/21 1229     Education Details edema control, exercises, use of heat    Person(s) Educated Patient    Methods Explanation;Demonstration;Handout    Comprehension Verbalized understanding;Returned demonstration;Need further instruction                 OT Long Term Goals - 11/14/21 1142       OT LONG TERM GOAL #1   Title Patient will be independent with home exercise program.    Baseline No current program    Time  3    Period Weeks  Status New    Target Date 11/25/21      OT LONG TERM GOAL #2   Title Patient will demonstrate understanding of joint protection principles and their application into her daily routine.    Baseline No current knowledge of joint protection principles at eval    Time 3    Period Weeks    Status New    Target Date 11/25/21      OT LONG TERM GOAL #3   Title Patient will complete basic self-care tasks with modified independence, using adaptive equipment or techniques as needed    Baseline Patient requiring mod to max assist for self-care at eval    Time 6    Period Weeks    Status New    Target Date 12/16/21      OT LONG TERM GOAL #4   Title Patient will demonstrate improvement in digit range of motion to be able to make a composite fist for grasping and holding objects.    Baseline Patient limited with range of motion and unable to make composite fist at eval    Time 2    Period Weeks    Status New    Target Date 11/18/21      OT LONG TERM GOAL #5   Title Patient will complete light home management tasks with minimal assistance    Baseline Max assist at eval    Time 6    Period Weeks    Status New    Target Date 12/16/21      Long Term Additional Goals   Additional Long Term Goals Yes      OT LONG TERM GOAL #6   Title Patient will improve bilateral grip strength 20 pounds to be able to hold and manage ADL items for self-care completion    Baseline Patient unable to squeeze shampoo or soap from bottle at eval    Time 6    Period Weeks    Status New    Target Date 12/16/21                   Plan - 11/14/21 1230     Clinical Impression Statement Pt demonstrating excellent progress with pain reduction and improved active movement in bilateral UEs.  She is pleased with her progress and reports feeling hopeful she will get better.  Pt's motion in hands improved, by the end of session she was able to demonstrate a gross composite fist motion.  Pt  able to demonstrate understanding of home exercise program and is purchasing a home unit for paraffin to help manage her symptoms.  Continue to work towards goals to manage symptoms of RA flare, decrease pain and improve ROM of bilateral UEs for improved functional use for self care and IADL tasks.    OT Occupational Profile and History Problem Focused Assessment - Including review of records relating to presenting problem    Occupational performance deficits (Please refer to evaluation for details): ADL's;Work;IADL's;Leisure;Social Participation;Rest and Sleep    Body Structure / Function / Physical Skills ADL;Dexterity;Flexibility;ROM;Strength;Coordination;Edema;FMC;IADL;Pain;UE functional use;Decreased knowledge of use of DME    Psychosocial Skills Coping Strategies;Environmental  Adaptations;Habits;Routines and Behaviors    Rehab Potential Good    Clinical Decision Making Several treatment options, min-mod task modification necessary    Comorbidities Affecting Occupational Performance: Presence of comorbidities impacting occupational performance    Comorbidities impacting occupational performance description: severe RA flare, joint pain all over body, breast cancer and awaiting surgery, decreased response to medications  for RA    Modification or Assistance to Complete Evaluation  No modification of tasks or assist necessary to complete eval    OT Frequency 2x / week    OT Duration 6 weeks    OT Treatment/Interventions Self-care/ADL training;Paraffin;Therapeutic exercise;DME and/or AE instruction;Fluidtherapy;Neuromuscular education;Manual Therapy;Moist Heat;Contrast Bath;Therapeutic activities;Patient/family education    Consulted and Agree with Plan of Care Patient             Patient will benefit from skilled therapeutic intervention in order to improve the following deficits and impairments:   Body Structure / Function / Physical Skills: ADL, Dexterity, Flexibility, ROM, Strength,  Coordination, Edema, FMC, IADL, Pain, UE functional use, Decreased knowledge of use of DME   Psychosocial Skills: Coping Strategies, Environmental  Adaptations, Habits, Routines and Behaviors   Visit Diagnosis: Pain in right hand  Pain in left hand  Muscle weakness (generalized)  Stiffness of left wrist, not elsewhere classified  Stiffness of right wrist, not elsewhere classified  Other lack of coordination    Problem List Patient Active Problem List   Diagnosis Date Noted   Basal cell carcinoma (BCC) in situ of skin 06/30/2020   PONV (postoperative nausea and vomiting) 06/30/2020   Diarrhea    Polyp of descending colon    Atypical ductal hyperplasia of left breast 09/16/2019   Arthritis of carpometacarpal (CMC) joint of right thumb 08/31/2018   Vitamin D deficiency 06/09/2018   Pain in right hand 11/16/2017   Thyroid disease 02/22/2017   Allergic rhinitis 01/19/2016   Asthma without status asthmaticus 01/19/2016   Degeneration of intervertebral disc of cervical region 01/19/2016   Acid reflux 01/19/2016   Cephalalgia 01/19/2016   Climacteric 01/19/2016   Osteopenia 01/19/2016   Tachycardia 12/19/2015   Asthma, moderate persistent 12/19/2015   Anxiety and depression 09/23/2014   Pure hypercholesterolemia 09/23/2014   Loralei Radcliffe T Onica Davidovich, OTR/L, CLT  Vadie Principato, OT 11/14/2021, 12:41 PM  [facility] [address] [place], [address] Phone: [phone]   Fax:  [phone]  Name: Cheyenne Nash MRN: [id number] Date of Birth: October 05, 1966

## 2021-11-14 NOTE — Therapy (Signed)
[facility] [address], [place], [address] Phone: [phone]   Fax:  [phone]  Occupational Therapy Evaluation  Patient Details  Name: Cheyenne Nash MRN: [id number] Date of Birth: January 19, 1966 Referring Provider (OT): Dr. Merita Norton   Encounter Date: 11/04/2021   OT End of Session - 11/14/21 1128     Visit Number 1    Number of Visits 12    Date for OT Re-Evaluation 12/16/21    OT Start Time 0900    OT Stop Time 0945    OT Time Calculation (min) 45 min    Activity Tolerance Patient tolerated treatment well    Behavior During Therapy [facility] for tasks assessed/performed             Past Medical History:  Diagnosis Date   Allergic rhinitis    Anxiety and depression    Asthma    COPD (chronic obstructive pulmonary disease) (HCC)    Degenerative disc disease, cervical    Fatigue    GERD (gastroesophageal reflux disease)    Heart murmur    Hypothyroidism    Lung infection    Meningitis    h/o recd some cns and chemo infusion   Menopausal syndrome    Menopause    Mitral regurgitation    Motion sickness    cars   Osteopenia    PONV (postoperative nausea and vomiting)    Tachycardia    Vertigo    1 episode, several yrs ago    Past Surgical History:  Procedure Laterality Date   ABDOMINAL HYSTERECTOMY     [place] lso   ARTHROSCOPIC REPAIR ACL     LEFT   BREAST BIOPSY Left 10/03/2019   affirm stereo bx of calcs, x marker, ADH   BREAST BIOPSY Left 10/27/2021   stereo bx, coil clip, path pending   BREAST LUMPECTOMY Left 11/2019   done at [place]  04/08/2016   [facility]   COLONOSCOPY WITH PROPOFOL N/A 04/03/2020   Procedure: COLONOSCOPY WITH BIOPSY;  Surgeon: Lucilla Lame, MD;  Location: [place] [facility];  Service: Endoscopy;  Laterality: N/A;  3   CYSTECTOMY Right    DILATION AND CURETTAGE OF UTERUS     ESOPHAGOGASTRODUODENOSCOPY (EGD) WITH PROPOFOL N/A 04/03/2020    Procedure: ESOPHAGOGASTRODUODENOSCOPY (EGD) WITH BIOPSY;  Surgeon: Lucilla Lame, MD;  Location: [place];  Service: Endoscopy;  Laterality: N/A;  3   KNEE SURGERY     LEFT    LAPAROSCOPY     lysis of adhesion   LEFT OOPHORECTOMY     POLYPECTOMY N/A 04/03/2020   Procedure: POLYPECTOMY;  Surgeon: Lucilla Lame, MD;  Location: [place];  Service: Endoscopy;  Laterality: N/A;   SHOULDER SURGERY     RIGHT    There were no vitals filed for this visit.   Subjective Assessment - 11/13/21 1121     Subjective  Pt was diagnosed with COVID in March and was then diagnosed with RA. She is waiting to have infusions but now also diagnosed with breast cancer last week so she will have to have surgery first.  Has to have a lumpectomy/chemo and radiation.    Pertinent History Pt is a 54 yo female who reports decline in health over the past year, COVID,  had gall bladder surgery, diagnosed with RA with recent severe flare and also now diagnosed with breast cancer.  She is waiting for surgery for her breast cancer before  she proceeding with infusions for RA flare.  She reports not responding to Humira.  Referred for OT for evaluation and treatment with use of heat therapy.    Patient Stated Goals Pt would like to be able to use her hands again, improve functional use.    Currently in Pain? Yes    Pain Score 6     Pain Location Hand    Pain Orientation Right;Left    Pain Descriptors / Indicators Aching;Sharp    Pain Type Chronic pain    Pain Onset More than a month ago    Pain Frequency Constant               [facility] OT Assessment - 11/14/21 1148       Assessment   Medical Diagnosis RA    Referring Provider (OT) Dr. Merita Norton    Onset Date/Surgical Date 01/27/21    Hand Dominance Right      Balance Screen   Has the patient fallen in the past 6 months No      Home  Environment   Lives With Spouse      ADL   Eating/Feeding Needs assist with cutting food    Grooming  Other (comment)   unable to dry hair, requires assist.  Able to do light makeup, brushing teeth is difficult   ADL comments Unable to wash hair by herself, cannot squeeze soap out of container or body wash, difficulty with opening containers, jars, cap of water bottle.  Difficulty with managing clothing negotiation, difficulty with buttons/zippers, patient has difficulty with grasping and holding a pen for handwriting, difficulty with typing skills for work-related tasks.      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    Prior Level of Function Medication Managment independent    Medication Management --   difficulty with opening containers/med bottles   Prior Level of Function Financial Management independent      Written Expression   Dominant Hand Right      Sensation   Light Touch Appears Intact    Stereognosis Appears Intact    Hot/Cold Appears Intact    Proprioception Appears Intact      Coordination   Fine Motor Movements are Fluid and Coordinated No    Coordination Decreased coordination with bilateral UEs with joint stiffness and pain      Right Hand AROM   R Thumb MCP 0-60 35 Degrees    R Thumb IP 0-80 40 Degrees    R Thumb Opposition to Index --   impaired bilaterally   R Index  MCP 0-90 40 Degrees    R Index PIP 0-100 60 Degrees    R Long  MCP 0-90 50 Degrees    R Long PIP 0-100 55 Degrees    R Ring  MCP 0-90 35 Degrees    R Ring PIP 0-100 55 Degrees    R Little  MCP 0-90 25 Degrees    R Little PIP 0-100 40 Degrees      Left Hand AROM   L Thumb MCP 0-60 25  Degrees    L Thumb IP 0-80 35 Degrees     L Thumb Opposition to Index --   impaired bilaterally   L Index  MCP 0-90 50 Degrees    L Index PIP 0-100 40 Degrees    L Long  MCP 0-90 40 Degrees    L Long PIP 0-100 40 Degrees    L Ring  MCP 0-90 35 Degrees    L Ring PIP 0-100 40 Degrees    L Little PIP 0-100 25 Degrees    L Little DIP 0-70 40 Degrees             TX: Paraffin bath to bilateral UEs to decrease pain, increase ROM and increase tissue mobility, 8 mins  Therapeutic Exercise: Pt educated on tendon gliding exercises for ROM as a part of her home exercise program, able to perform with therapist demonstration and cues, issued handout.    Educated on use of contrast for edema control, alternating with warm and cold, ending with warm and performing ROM exercises.                   OT Education - 11/14/21 1127     Education Details role of OT, goals, plan of care, use of contrast for edema, exercises for ROM    Person(s) Educated Patient    Methods Explanation;Demonstration;Handout    Comprehension Verbalized understanding;Returned demonstration;Need further instruction                 OT Long Term Goals - 11/14/21 1142       OT LONG TERM GOAL #1   Title Patient will be independent with home exercise program.    Baseline No current program    Time 3    Period Weeks    Status New    Target Date 11/25/21      OT LONG TERM GOAL #2   Title Patient will demonstrate understanding of joint protection principles and their application into her daily routine.    Baseline No current knowledge of joint protection principles at eval    Time 3    Period Weeks    Status New    Target Date 11/25/21      OT LONG TERM GOAL #3   Title Patient will complete basic self-care tasks with modified independence, using adaptive equipment or techniques as needed    Baseline Patient requiring mod to max assist for self-care at eval    Time 6    Period Weeks    Status New    Target Date 12/16/21      OT  LONG TERM GOAL #4   Title Patient will demonstrate improvement in digit range of motion to be able to make a composite fist for grasping and holding objects.    Baseline Patient limited with range of motion and unable to make composite fist at eval    Time 2    Period Weeks    Status New    Target Date 11/18/21      OT LONG TERM GOAL #5   Title Patient will complete light home management tasks with minimal assistance    Baseline Max assist at eval    Time 6    Period Weeks    Status New    Target Date 12/16/21      Long Term Additional Goals   Additional Long Term Goals Yes      OT LONG TERM GOAL #6   Title Patient will  improve bilateral grip strength 20 pounds to be able to hold and manage ADL items for self-care completion    Baseline Patient unable to squeeze shampoo or soap from bottle at eval    Time 6    Period Weeks    Status New    Target Date 12/16/21                   Plan - 11/14/21 1129     Clinical Impression Statement Patient is a 55 year old female who was diagnosed this year with rheumatoid arthritis with a severe flare, she also had gallbladder surgery and now diagnosed with breast cancer and awaiting surgery.  She has not responded to some of the medications to help manage RA and is planning to receive an infusion but waiting to hear about her breast cancer surgery date prior to having infusions.  She was referred to occupational therapy for evaluation and treatment of bilateral hand pain with the use of heat therapy.  She presents with severe joint pain, muscle weakness, decreased range of motion, decreased ability to perform basic self-care and ADL tasks.  She lives with her husband and was previously independent with all tasks and working full-time.  With the recent severe flare of RA she has difficulty with performing all tasks and husband has to assist with bathing, dressing, grooming,  meal preparation and home management tasks.  She is able to drive on  some days if her pain is decreased and motion is sufficient.  She would benefit from skilled occupational therapy intervention to decrease edema, decreased pain, improve range of motion, and increase overall functional strength and use of her bilateral upper extremities to be able to continue to care for herself and return to participation in her normal daily activities.  She would also benefit from education with joint protection principles to help manage her disease process.    OT Occupational Profile and History Problem Focused Assessment - Including review of records relating to presenting problem    Occupational performance deficits (Please refer to evaluation for details): ADL's;Work;IADL's;Leisure;Social Participation;Rest and Sleep    Body Structure / Function / Physical Skills ADL;Dexterity;Flexibility;ROM;Strength;Coordination;Edema;FMC;IADL;Pain;UE functional use;Decreased knowledge of use of DME    Psychosocial Skills Coping Strategies;Environmental  Adaptations;Habits;Routines and Behaviors    Rehab Potential Good    Clinical Decision Making Several treatment options, min-mod task modification necessary    Comorbidities Affecting Occupational Performance: Presence of comorbidities impacting occupational performance    Comorbidities impacting occupational performance description: severe RA flare, joint pain all over body, breast cancer and awaiting surgery, decreased response to medications for RA    Modification or Assistance to Complete Evaluation  No modification of tasks or assist necessary to complete eval    OT Frequency 2x / week    OT Duration 6 weeks    OT Treatment/Interventions Self-care/ADL training;Paraffin;Therapeutic exercise;DME and/or AE instruction;Fluidtherapy;Neuromuscular education;Manual Therapy;Moist Heat;Contrast Bath;Therapeutic activities;Patient/family education    Consulted and Agree with Plan of Care Patient             Patient will benefit from skilled  therapeutic intervention in order to improve the following deficits and impairments:   Body Structure / Function / Physical Skills: ADL, Dexterity, Flexibility, ROM, Strength, Coordination, Edema, FMC, IADL, Pain, UE functional use, Decreased knowledge of use of DME   Psychosocial Skills: Coping Strategies, Environmental  Adaptations, Habits, Routines and Behaviors   Visit Diagnosis: Pain in right hand - Plan: Ot plan of care cert/re-cert  Pain in left hand -  Plan: Ot plan of care cert/re-cert  Muscle weakness (generalized) - Plan: Ot plan of care cert/re-cert  Stiffness of left wrist, not elsewhere classified - Plan: Ot plan of care cert/re-cert  Stiffness of right wrist, not elsewhere classified - Plan: Ot plan of care cert/re-cert  Other lack of coordination - Plan: Ot plan of care cert/re-cert    Problem List Patient Active Problem List   Diagnosis Date Noted   Basal cell carcinoma (BCC) in situ of skin 06/30/2020   PONV (postoperative nausea and vomiting) 06/30/2020   Diarrhea    Polyp of descending colon    Atypical ductal hyperplasia of left breast 09/16/2019   Arthritis of carpometacarpal (CMC) joint of right thumb 08/31/2018   Vitamin D deficiency 06/09/2018   Pain in right hand 11/16/2017   Thyroid disease 02/22/2017   Allergic rhinitis 01/19/2016   Asthma without status asthmaticus 01/19/2016   Degeneration of intervertebral disc of cervical region 01/19/2016   Acid reflux 01/19/2016   Cephalalgia 01/19/2016   Climacteric 01/19/2016   Osteopenia 01/19/2016   Tachycardia 12/19/2015   Asthma, moderate persistent 12/19/2015   Anxiety and depression 09/23/2014   Pure hypercholesterolemia 09/23/2014   Brei Pociask T Joleena Weisenburger, OTR/L, CLT  Lus Kriegel, OT 11/14/2021, 12:06 PM  [facility] [address], [place], [address] Phone: [phone]   Fax:  [phone]  Name: Cheyenne Nash MRN:  [id number] Date of Birth: 1966-10-20

## 2021-11-17 ENCOUNTER — Encounter: Payer: BC Managed Care – PPO | Admitting: Occupational Therapy

## 2021-11-18 ENCOUNTER — Ambulatory Visit: Payer: BC Managed Care – PPO | Admitting: Occupational Therapy

## 2021-11-20 ENCOUNTER — Encounter: Payer: Self-pay | Admitting: Occupational Therapy

## 2021-11-20 ENCOUNTER — Other Ambulatory Visit: Payer: Self-pay

## 2021-11-20 ENCOUNTER — Ambulatory Visit: Payer: BC Managed Care – PPO | Attending: Rheumatology | Admitting: Occupational Therapy

## 2021-11-20 DIAGNOSIS — M25632 Stiffness of left wrist, not elsewhere classified: Secondary | ICD-10-CM | POA: Diagnosis present

## 2021-11-20 DIAGNOSIS — M6281 Muscle weakness (generalized): Secondary | ICD-10-CM | POA: Insufficient documentation

## 2021-11-20 DIAGNOSIS — M25631 Stiffness of right wrist, not elsewhere classified: Secondary | ICD-10-CM | POA: Insufficient documentation

## 2021-11-20 DIAGNOSIS — M79641 Pain in right hand: Secondary | ICD-10-CM | POA: Insufficient documentation

## 2021-11-20 DIAGNOSIS — M79642 Pain in left hand: Secondary | ICD-10-CM | POA: Diagnosis present

## 2021-11-20 DIAGNOSIS — R278 Other lack of coordination: Secondary | ICD-10-CM | POA: Diagnosis present

## 2021-11-20 NOTE — Therapy (Signed)
[facility] [address], [place], [address] Phone: [phone]   Fax:  [phone]  Occupational Therapy Treatment/Discharge Summary  Patient Details  Name: Cheyenne Nash MRN: [id number] Date of Birth: Mar 01, 1966 Referring Provider (OT): Dr. Merita Norton   Encounter Date: 11/20/2021   OT End of Session - 11/20/21 0939     Visit Number 4    Number of Visits 12    Date for OT Re-Evaluation 12/16/21    OT Start Time 0845    OT Stop Time 0935    OT Time Calculation (min) 50 min    Activity Tolerance Patient tolerated treatment well    Behavior During Therapy [facility] for tasks assessed/performed             Past Medical History:  Diagnosis Date   Allergic rhinitis    Anxiety and depression    Asthma    COPD (chronic obstructive pulmonary disease) (HCC)    Degenerative disc disease, cervical    Fatigue    GERD (gastroesophageal reflux disease)    Heart murmur    Hypothyroidism    Lung infection    Meningitis    h/o recd some cns and chemo infusion   Menopausal syndrome    Menopause    Mitral regurgitation    Motion sickness    cars   Osteopenia    PONV (postoperative nausea and vomiting)    Tachycardia    Vertigo    1 episode, several yrs ago    Past Surgical History:  Procedure Laterality Date   ABDOMINAL HYSTERECTOMY     tah lso   ARTHROSCOPIC REPAIR ACL     LEFT   BREAST BIOPSY Left 10/03/2019   affirm stereo bx of calcs, x marker, ADH   BREAST BIOPSY Left 10/27/2021   stereo bx, coil clip, path pending   BREAST LUMPECTOMY Left 11/2019   done at [place]  04/08/2016   [facility]   COLONOSCOPY WITH PROPOFOL N/A 04/03/2020   Procedure: COLONOSCOPY WITH BIOPSY;  Surgeon: Lucilla Lame, MD;  Location: [place];  Service: Endoscopy;  Laterality: N/A;  3   CYSTECTOMY Right    DILATION AND CURETTAGE OF UTERUS     ESOPHAGOGASTRODUODENOSCOPY (EGD) WITH PROPOFOL  N/A 04/03/2020   Procedure: ESOPHAGOGASTRODUODENOSCOPY (EGD) WITH BIOPSY;  Surgeon: Lucilla Lame, MD;  Location: [place];  Service: Endoscopy;  Laterality: N/A;  3   KNEE SURGERY     LEFT    LAPAROSCOPY     lysis of adhesion   LEFT OOPHORECTOMY     POLYPECTOMY N/A 04/03/2020   Procedure: POLYPECTOMY;  Surgeon: Lucilla Lame, MD;  Location: [place];  Service: Endoscopy;  Laterality: N/A;   SHOULDER SURGERY     RIGHT    There were no vitals filed for this visit.   Subjective Assessment - 11/20/21 0848     Subjective  Pt reports she is doing well.  States, "My hands are so much better!"  Reports she was able to dress herself today, make the bed, drive and has been making some meals like french toast last night.  Her husband still helps in the kitchen as needed.  She was able to return to work for a couple days and was able to use the computer and complete writing a report.    Pertinent History Pt is a 56 yo female who reports decline in health over the past year, COVID,  had gall bladder surgery, diagnosed with RA with recent severe flare and also now diagnosed with breast cancer.  She is waiting for surgery for her breast cancer before she proceeding with infusions for RA flare.  She reports not responding to Humira.  Referred for OT for evaluation and treatment with use of heat therapy.    Patient Stated Goals Pt would like to be able to use her hands again, improve functional use.    Currently in Pain? No/denies    Pain Score 0-No pain    Pain Onset More than a month ago                [facility] OT Assessment - 11/20/21 0001       Assessment   Medical Diagnosis RA    Referring Provider (OT) Dr. Merita Norton    Onset Date/Surgical Date 01/27/21    Hand Dominance Right      Balance Screen   Has the patient fallen in the past 6 months No      Home  Environment   Lives With Spouse      ADL   Eating/Feeding Needs assist with cutting food    Grooming Other  (comment)   unable to dry hair, requires assist.  Able to do light makeup, brushing teeth is difficult   ADL comments mild difficulty now with managing containers such as shampoo, body wash.  Able to wash hair, complete bathing and dressing.      IADL   Prior Level of Function Shopping independent    Shopping Shops independently for small purchases    Prior Level of Function Light Housekeeping independent    Light Housekeeping Performs light daily tasks such as dishwashing, bed making    Prior Level of Function Meal Prep independent    Meal Prep Able to complete simple warm meal prep    Prior Level of Function Passenger transport manager own vehicle    Prior Level of Function Medication Managment independent    Medication Management --   difficulty with opening containers/med bottles   Prior Level of Function Financial Management independent      Written Expression   Dominant Hand Right      Sensation   Light Touch Appears Intact    Stereognosis Appears Intact    Hot/Cold Appears Intact    Proprioception Appears Intact      Coordination   Fine Motor Movements are Fluid and Coordinated No    Coordination Decreased coordination with bilateral UEs with joint stiffness and pain      AROM   Right Forearm Pronation 75 Degrees    Right Forearm Supination 90 Degrees    Left Forearm Pronation 90 Degrees    Left Forearm Supination 95 Degrees    Right Wrist Extension 65 Degrees    Right Wrist Flexion 75 Degrees    Right Wrist Radial Deviation 28 Degrees    Right Wrist Ulnar Deviation 40 Degrees    Left Wrist Extension 60 Degrees    Left Wrist Flexion 82 Degrees    Left Wrist Radial Deviation 20 Degrees    Left Wrist Ulnar Deviation 28 Degrees      Strength   Right Hand Grip (lbs) 20    Right Hand Lateral Pinch 5 lbs    Right Hand 3 Point Pinch 7 lbs    Left Hand Grip (lbs) 20    Left Hand Lateral Pinch 7 lbs    Left Hand  3 Point Pinch 8 lbs       Right Hand AROM   R Thumb MCP 0-60 45 Degrees    R Thumb IP 0-80 75 Degrees    R Index  MCP 0-90 75 Degrees    R Index PIP 0-100 90 Degrees    R Long  MCP 0-90 85 Degrees    R Long PIP 0-100 90 Degrees    R Ring  MCP 0-90 90 Degrees    R Ring PIP 0-100 90 Degrees    R Little  MCP 0-90 90 Degrees    R Little PIP 0-100 90 Degrees      Left Hand AROM   L Thumb MCP 0-60 40 Degrees    L Thumb IP 0-80 65 Degrees    L Index  MCP 0-90 60 Degrees    L Index PIP 0-100 90 Degrees    L Long  MCP 0-90 70 Degrees    L Long PIP 0-100 90 Degrees    L Ring  MCP 0-90 75 Degrees    L Ring PIP 0-100 90 Degrees    L Little  MCP 0-90 75 Degrees    L Little PIP 0-100 90 Degrees             Contrast:  Use of contrast with alternating warm and cold to manage edema, decrease pain and increase ROM in UE. Performed for 11 mins total with alternating 3 mins warm, 1 min cold.   Therapeutic Exercises: Following contrast, pt able to demonstrate tendon gliding exercises independently for UE to increase active ROM, all ROM and joints reassessed.  Refer to flowsheet for specific measurements. Significant progress in all areas  Issued red foam pieces to assist with building up grip surfaces.    Reviewed home program to include use of contrast for edema, heat therapy with use of paraffin at home, ROM exercises, joint protection principles and how to apply to daily tasks.  Pt able to demonstrate understanding and is independent with HEP.    Response to tx: Pt with excellent response to therapeutic intervention.  Her measurements of bilateral hand and wrist is back to within normal limits.  She is independent with her home program which includes, use of contrast for edema, heat therapy with use of paraffin, ROM exercises, and joint protection principles.  She has met her goals and feels she can continue with her current program at home.  Will discharge at this time, please re consult if needs arise in the future.                  OT Education - 11/20/21 0939     Education Details edema control, exercises, use of heat, joint protection principles, progress, HEP    Person(s) Educated Patient    Methods Explanation;Demonstration;Handout    Comprehension Verbalized understanding;Returned demonstration;Need further instruction                 OT Long Term Goals - 11/20/21 0940       OT LONG TERM GOAL #1   Title Patient will be independent with home exercise program.    Baseline No current program    Time 3    Period Weeks    Status Achieved    Target Date 11/25/21      OT LONG TERM GOAL #2   Title Patient will demonstrate understanding of joint protection principles and their application into her daily routine.    Baseline No current knowledge of  joint protection principles at eval    Time 3    Period Weeks    Status Achieved    Target Date 11/25/21      OT LONG TERM GOAL #3   Title Patient will complete basic self-care tasks with modified independence, using adaptive equipment or techniques as needed    Baseline Patient requiring mod to max assist for self-care at eval    Time 6    Period Weeks    Status Achieved    Target Date 12/16/21      OT LONG TERM GOAL #4   Title Patient will demonstrate improvement in digit range of motion to be able to make a composite fist for grasping and holding objects.    Baseline Patient limited with range of motion and unable to make composite fist at eval    Time 2    Period Weeks    Status Achieved    Target Date 11/18/21      OT LONG TERM GOAL #5   Title Patient will complete light home management tasks with minimal assistance    Baseline Max assist at eval    Time 6    Period Weeks    Status Achieved    Target Date 12/16/21      OT LONG TERM GOAL #6   Title Patient will improve bilateral grip strength 20 pounds to be able to hold and manage ADL items for self-care completion    Baseline Patient unable to squeeze shampoo  or soap from bottle at eval    Time 6    Period Weeks    Status Achieved    Target Date 12/16/21                   Plan - 11/20/21 0939     Clinical Impression Statement Pt with excellent response to therapeutic intervention.  Her measurements of bilateral hand and wrist is back to within normal limits.  She is independent with her home program which includes, use of contrast for edema, heat therapy with use of paraffin, ROM exercises, and joint protection principles.  She has met her goals and feels she can continue with her current program at home.  Will discharge at this time, please re consult if needs arise in the future.    OT Occupational Profile and History Problem Focused Assessment - Including review of records relating to presenting problem    Occupational performance deficits (Please refer to evaluation for details): ADL's;Work;IADL's;Leisure;Social Participation;Rest and Sleep    Body Structure / Function / Physical Skills ADL;Dexterity;Flexibility;ROM;Strength;Coordination;Edema;FMC;IADL;Pain;UE functional use;Decreased knowledge of use of DME    Psychosocial Skills Coping Strategies;Environmental  Adaptations;Habits;Routines and Behaviors    Rehab Potential Good    Clinical Decision Making Several treatment options, min-mod task modification necessary    Comorbidities Affecting Occupational Performance: Presence of comorbidities impacting occupational performance    Comorbidities impacting occupational performance description: severe RA flare, joint pain all over body, breast cancer and awaiting surgery, decreased response to medications for RA    Modification or Assistance to Complete Evaluation  No modification of tasks or assist necessary to complete eval    OT Frequency 2x / week    OT Duration 6 weeks    OT Treatment/Interventions Self-care/ADL training;Paraffin;Therapeutic exercise;DME and/or AE instruction;Fluidtherapy;Neuromuscular education;Manual Therapy;Moist  Heat;Contrast Bath;Therapeutic activities;Patient/family education    Consulted and Agree with Plan of Care Patient             Patient will benefit from skilled  therapeutic intervention in order to improve the following deficits and impairments:   Body Structure / Function / Physical Skills: ADL, Dexterity, Flexibility, ROM, Strength, Coordination, Edema, FMC, IADL, Pain, UE functional use, Decreased knowledge of use of DME   Psychosocial Skills: Coping Strategies, Environmental  Adaptations, Habits, Routines and Behaviors   Visit Diagnosis: Pain in right hand  Pain in left hand  Muscle weakness (generalized)  Stiffness of left wrist, not elsewhere classified  Stiffness of right wrist, not elsewhere classified  Other lack of coordination    Problem List Patient Active Problem List   Diagnosis Date Noted   Basal cell carcinoma (BCC) in situ of skin 06/30/2020   PONV (postoperative nausea and vomiting) 06/30/2020   Diarrhea    Polyp of descending colon    Atypical ductal hyperplasia of left breast 09/16/2019   Arthritis of carpometacarpal (CMC) joint of right thumb 08/31/2018   Vitamin D deficiency 06/09/2018   Pain in right hand 11/16/2017   Thyroid disease 02/22/2017   Allergic rhinitis 01/19/2016   Asthma without status asthmaticus 01/19/2016   Degeneration of intervertebral disc of cervical region 01/19/2016   Acid reflux 01/19/2016   Cephalalgia 01/19/2016   Climacteric 01/19/2016   Osteopenia 01/19/2016   Tachycardia 12/19/2015   Asthma, moderate persistent 12/19/2015   Anxiety and depression 09/23/2014   Pure hypercholesterolemia 09/23/2014   Ardella Chhim T Shwanda Soltis, OTR/L, CLT  Voyd Groft, OT 11/20/2021, 9:53 AM  [facility] [address] [place], [address] Phone: [phone]   Fax:  [phone]  Name: OLLIE DELANO MRN: [id number] Date of Birth: [birth year]

## 2021-11-27 ENCOUNTER — Encounter: Payer: BC Managed Care – PPO | Admitting: Obstetrics and Gynecology

## 2022-01-06 ENCOUNTER — Encounter: Payer: BC Managed Care – PPO | Admitting: Obstetrics and Gynecology

## 2022-01-06 DIAGNOSIS — Z1231 Encounter for screening mammogram for malignant neoplasm of breast: Secondary | ICD-10-CM

## 2022-01-06 DIAGNOSIS — Z01419 Encounter for gynecological examination (general) (routine) without abnormal findings: Secondary | ICD-10-CM

## 2022-02-09 ENCOUNTER — Other Ambulatory Visit: Payer: Self-pay | Admitting: Obstetrics and Gynecology

## 2022-02-09 DIAGNOSIS — N951 Menopausal and female climacteric states: Secondary | ICD-10-CM

## 2022-07-22 ENCOUNTER — Other Ambulatory Visit: Payer: Self-pay | Admitting: Internal Medicine

## 2022-07-22 DIAGNOSIS — R1084 Generalized abdominal pain: Secondary | ICD-10-CM

## 2022-07-27 ENCOUNTER — Ambulatory Visit
Admission: RE | Admit: 2022-07-27 | Discharge: 2022-07-27 | Disposition: A | Discharge status: Home / Self Care | Payer: BC Managed Care – PPO | Source: Ambulatory Visit | Attending: Internal Medicine | Admitting: Internal Medicine

## 2022-07-27 DIAGNOSIS — R1084 Generalized abdominal pain: Secondary | ICD-10-CM | POA: Diagnosis present

## 2022-07-27 MED ORDER — IOHEXOL 300 MG/ML  SOLN
100.0000 mL | Freq: Once | INTRAMUSCULAR | Status: AC | PRN
  Administered 2022-07-27: 100 mL via INTRAVENOUS

## 2022-12-16 ENCOUNTER — Ambulatory Visit: Payer: Self-pay

## 2024-04-13 ENCOUNTER — Ambulatory Visit
Admission: EM | Admit: 2024-04-13 | Discharge: 2024-04-13 | Disposition: A | Discharge status: Home / Self Care | Attending: Emergency Medicine | Admitting: Emergency Medicine

## 2024-04-13 ENCOUNTER — Ambulatory Visit: Payer: Self-pay

## 2024-04-13 DIAGNOSIS — J069 Acute upper respiratory infection, unspecified: Secondary | ICD-10-CM

## 2024-04-13 DIAGNOSIS — R0602 Shortness of breath: Secondary | ICD-10-CM

## 2024-04-13 DIAGNOSIS — J4521 Mild intermittent asthma with (acute) exacerbation: Secondary | ICD-10-CM | POA: Diagnosis not present

## 2024-04-13 MED ORDER — AZITHROMYCIN 250 MG PO TABS: 250.0000 mg | ORAL_TABLET | Freq: Every day | ORAL | 0 refills | Status: DC

## 2024-04-13 MED ORDER — BENZONATATE 100 MG PO CAPS: 100.0000 mg | ORAL_CAPSULE | Freq: Three times a day (TID) | ORAL | 0 refills | Status: AC | PRN

## 2024-04-13 MED ORDER — ALBUTEROL SULFATE (2.5 MG/3ML) 0.083% IN NEBU: 2.5000 mg | INHALATION_SOLUTION | Freq: Four times a day (QID) | RESPIRATORY_TRACT | 0 refills | Status: AC | PRN

## 2024-04-13 MED ORDER — PREDNISONE 10 MG PO TABS: 40.0000 mg | ORAL_TABLET | Freq: Every day | ORAL | 0 refills | Status: AC

## 2024-04-13 NOTE — ED Provider Notes (Signed)
 [facility]    CSN: [id number] Arrival date & time: 04/13/24  1352      History   Chief Complaint Chief Complaint  Patient presents with   Cough    HPI Cheyenne Nash is a 58 y.o. female.  Patient presents with 6-day history of productive cough.  She has shortness of breath and rib pain when she is coughing.  No fever or chest pain.  She has not used her albuterol inhaler in the last few days.  She is out of her albuterol nebulizer solution and requests a refill.  Her medical history includes asthma and allergies.  The history is provided by the patient and medical records.    Past Medical History:  Diagnosis Date   Allergic rhinitis    Anxiety and depression    Asthma    COPD (chronic obstructive pulmonary disease) (HCC)    Degenerative disc disease, cervical    Fatigue    GERD (gastroesophageal reflux disease)    Heart murmur    Hypothyroidism    Lung infection    Meningitis    h/o recd some cns and chemo infusion   Menopausal syndrome    Menopause    Mitral regurgitation    Motion sickness    cars   Osteopenia    PONV (postoperative nausea and vomiting)    Tachycardia    Vertigo    1 episode, several yrs ago    Patient Active Problem List   Diagnosis Date Noted   Basal cell carcinoma (BCC) in situ of skin 06/30/2020   PONV (postoperative nausea and vomiting) 06/30/2020   Diarrhea    Polyp of descending colon    Atypical ductal hyperplasia of left breast 09/16/2019   Arthritis of carpometacarpal (CMC) joint of right thumb 08/31/2018   Vitamin D deficiency 06/09/2018   Pain in right hand 11/16/2017   Thyroid disease 02/22/2017   Allergic rhinitis 01/19/2016   Asthma without status asthmaticus 01/19/2016   Degeneration of intervertebral disc of cervical region 01/19/2016   Acid reflux 01/19/2016   Cephalalgia 01/19/2016   Climacteric 01/19/2016   Osteopenia 01/19/2016   Tachycardia 12/19/2015   Asthma, moderate persistent 12/19/2015    Anxiety and depression 09/23/2014   Pure hypercholesterolemia 09/23/2014    Past Surgical History:  Procedure Laterality Date   ABDOMINAL HYSTERECTOMY     tah lso   ARTHROSCOPIC REPAIR ACL     LEFT   BREAST BIOPSY Left 10/03/2019   affirm stereo bx of calcs, x marker, ADH   BREAST BIOPSY Left 10/27/2021   stereo bx, coil clip, path pending   BREAST LUMPECTOMY Left 11/2019   done at [facility]   CARDIAC CATHETERIZATION  04/08/2016   [facility]   COLONOSCOPY WITH PROPOFOL  N/A 04/03/2020   Procedure: COLONOSCOPY WITH BIOPSY;  Surgeon: Marnee Sink, MD;  Location: [facility];  Service: Endoscopy;  Laterality: N/A;  3   CYSTECTOMY Right    DILATION AND CURETTAGE OF UTERUS     ESOPHAGOGASTRODUODENOSCOPY (EGD) WITH PROPOFOL  N/A 04/03/2020   Procedure: ESOPHAGOGASTRODUODENOSCOPY (EGD) WITH BIOPSY;  Surgeon: Marnee Sink, MD;  Location[facility];  Service: Endoscopy;  Laterality: N/A;  3   KNEE SURGERY     LEFT    LAPAROSCOPY     lysis of adhesion   LEFT OOPHORECTOMY     POLYPECTOMY N/A 04/03/2020   Procedure: POLYPECTOMY;  Surgeon: Marnee Sink, MD;  Location: [facility];  Service: Endoscopy;  Laterality: N/A;   SHOULDER SURGERY  RIGHT    OB History     Gravida  2   Para  2   Term  2   Preterm      AB      Living  2      SAB      IAB      Ectopic      Multiple      Live Births  2            Home Medications    Prior to Admission medications   Medication Sig Start Date End Date Taking? Authorizing Provider  albuterol  (PROVENTIL ) (2.5 MG/3ML) 0.083% nebulizer solution Take 3 mLs (2.5 mg total) by nebulization every 6 (six) hours as needed for wheezing or shortness of breath. 04/13/24  Yes Wellington Half, NP  azithromycin  (ZITHROMAX ) 250 MG tablet Take 1 tablet (250 mg total) by mouth daily. Take first 2 tablets together, then 1 every day until finished. 04/13/24  Yes Wellington Half, NP  benzonatate  (TESSALON ) 100 MG capsule Take 1 capsule  (100 mg total) by mouth 3 (three) times daily as needed for cough. 04/13/24  Yes Wellington Half, NP  predniSONE  (DELTASONE ) 10 MG tablet Take 4 tablets (40 mg total) by mouth daily for 5 days. 04/13/24 04/18/24 Yes Wellington Half, NP  Cholecalciferol (VITAMIN D3) 1.25 MG (50000 UT) TABS Take by mouth once a week.    [provider]  EPINEPHrine 0.3 mg/0.3 mL IJ SOAJ injection Inject into the muscle. 05/27/20   [provider]  estradiol  (ESTRACE ) 1 MG tablet TAKE 1 TABLET(1 MG) BY MOUTH DAILY 02/09/22   Zenobia Hila, MD  fluticasone  [facility]) 50 MCG/ACT nasal spray Place 2 sprays into the nose daily.  10/06/15 03/31/20  [provider]  Fluticasone -Salmeterol (ADVAIR) 250-50 MCG/DOSE AEPB Inhale 1 puff into the lungs daily.    [provider]  levothyroxine (SYNTHROID, LEVOTHROID) 75 MCG tablet Take 75 mcg by mouth daily before breakfast.    [provider]  montelukast (SINGULAIR) 10 MG tablet Take 10 mg by mouth at bedtime.    [provider]  omeprazole  (PRILOSEC) 40 MG capsule Take 40 mg by mouth daily. 06/22/20   [provider]  ondansetron (ZOFRAN-ODT) 4 MG disintegrating tablet  11/22/19   [provider]  Sodium Sulfate-Mag Sulfate-KCl (SUTAB ) [phone] MG TABS Take 376 mg by mouth as directed. Patient not taking: Reported on 11/21/2020 04/02/20   Marnee Sink, MD  Vitamin D, Ergocalciferol, (DRISDOL) 1.25 MG (50000 UNIT) CAPS capsule Take 50,000 Units by mouth once a week. 05/15/20   [provider]  azelastine (ASTELIN) 0.1 % nasal spray Place 2 sprays into the nose 2 (two) times daily.  10/06/15 10/13/19  [provider]  cetirizine (ZYRTEC) 10 MG tablet Take 10 mg by mouth daily.   10/13/19  [provider]  dexlansoprazole (DEXILANT) 60 MG capsule Take by mouth. 01/19/16 10/13/19  [provider]  DULoxetine (CYMBALTA) 30 MG capsule 1 capsule qd for 2 weeks, then increase to 2 capsules once  a day. 07/25/15 10/13/19  [provider]  levalbuterol  (XOPENEX  HFA) 45 MCG/ACT inhaler Inhale 1 puff into the lungs every 4 (four) hours as needed for wheezing. Patient not taking: Reported on 11/12/2016 12/19/15 10/13/19  Gollan, Timothy J, MD  phentermine  (ADIPEX-P ) 37.5 MG tablet Take 1 tablet (37.5 mg total) by mouth daily before breakfast. 11/12/16 10/13/19  Shambley, Melody N, CNM  traZODone (DESYREL) 100 MG tablet Reported  on 01/27/2016 12/15/15 10/13/19  [provider]    Family History Family History  Problem Relation Age of Onset   Hypertension Mother    Hypertension Father    Heart disease Father 38       CABG   Heart attack Father    Depression Maternal Grandmother    Cancer Neg Hx    Breast cancer Neg Hx     Social History Social History   Tobacco Use   Smoking status: Never   Smokeless tobacco: Never  Vaping Use   Vaping status: Never Used  Substance Use Topics   Alcohol use: Yes    Comment: 2-3 beers per month   Drug use: No     Allergies   Sulfa antibiotics   Review of Systems Review of Systems  Constitutional:  Negative for chills and fever.  HENT:  Negative for ear pain and sore throat.   Respiratory:  Positive for cough and shortness of breath.   Cardiovascular:  Negative for chest pain and palpitations.     Physical Exam Triage Vital Signs ED Triage Vitals  Encounter Vitals Group     BP 04/13/24 1421 122/86     Systolic BP Percentile --      Diastolic BP Percentile --      Pulse Rate 04/13/24 1421 98     Resp 04/13/24 1421 18     Temp 04/13/24 1421 98.9 F (37.2 C)     Temp Source 04/13/24 1421 Oral     SpO2 04/13/24 1421 100 %     Weight --      Height --      Head Circumference --      Peak Flow --      Pain Score 04/13/24 1414 0     Pain Loc --      Pain Education --      Exclude from Growth Chart --    No data found.  Updated Vital Signs BP 122/86 (BP Location: Right Arm)   Pulse 98   Temp 98.9 F (37.2  C) (Oral)   Resp 18   SpO2 100%   Visual Acuity Right Eye Distance:   Left Eye Distance:   Bilateral Distance:    Right Eye Near:   Left Eye Near:    Bilateral Near:     Physical Exam Constitutional:      General: She is not in acute distress. HENT:     Right Ear: Tympanic membrane normal.     Left Ear: Tympanic membrane normal.     Nose: Nose normal.     Mouth/Throat:     Mouth: Mucous membranes are moist.     Pharynx: Oropharynx is clear.  Cardiovascular:     Rate and Rhythm: Normal rate and regular rhythm.     Heart sounds: Normal heart sounds.  Pulmonary:     Effort: Pulmonary effort is normal. No respiratory distress.     Breath sounds: Normal breath sounds. No wheezing.     Comments: Frequent nonproductive cough. Neurological:     Mental Status: She is alert.      UC Treatments / Results  Labs (all labs ordered are listed, but only abnormal results are displayed) Labs Reviewed - No data to display  EKG   Radiology No results found.  Procedures Procedures (including critical care time)  Medications Ordered in UC Medications - No data to display  Initial Impression / Assessment and Plan / UC Course  I have reviewed  the triage vital signs and the nursing notes.  Pertinent labs & imaging results that were available during my care of the patient were reviewed by me and considered in my medical decision making (see chart for details).    Asthma exacerbation, upper respiratory infection, shortness of breath.  Afebrile and vital signs are stable.  Lungs are clear and O2 sat is 100% on room air.  Per patient request, refill of albuterol nebulizer solution sent to pharmacy.  Also treating today with prednisone, Zithromax, Tessalon Perles.  Instructed patient to follow-up with her PCP on Monday.  ED precautions given.  Education provided on asthma and URI.  She agrees to plan of care.  Final Clinical Impressions(s) / UC Diagnoses   Final diagnoses:  Mild  intermittent asthma with acute exacerbation  Acute upper respiratory infection  Shortness of breath     Discharge Instructions      Take the prednisone, Zithromax, Tessalon Perles as directed.  A refill of your albuterol nebulizer solution was also sent to the pharmacy.    Follow up with your primary care provider on Monday.  Go to the emergency department if you have worsening symptoms.      ED Prescriptions     Medication Sig Dispense Auth. Provider   albuterol (PROVENTIL) (2.5 MG/3ML) 0.083% nebulizer solution Take 3 mLs (2.5 mg total) by nebulization every 6 (six) hours as needed for wheezing or shortness of breath. 75 mL Wellington Half, NP   predniSONE (DELTASONE) 10 MG tablet Take 4 tablets (40 mg total) by mouth daily for 5 days. 20 tablet Wellington Half, NP   azithromycin (ZITHROMAX) 250 MG tablet Take 1 tablet (250 mg total) by mouth daily. Take first 2 tablets together, then 1 every day until finished. 6 tablet Wellington Half, NP   benzonatate (TESSALON) 100 MG capsule Take 1 capsule (100 mg total) by mouth 3 (three) times daily as needed for cough. 21 capsule Wellington Half, NP      PDMP not reviewed this encounter.   Wellington Half, NP 04/13/24 1501

## 2024-04-13 NOTE — ED Triage Notes (Signed)
 Pt reports cough starting 5-6 days ago, productive for thick green mucous. Took breathing tx last night but did not help. Ribs/back hurting from coughing. Hasn't slept for three nights. Hoarse this morning. A little SOB.   Pt has nebulizer at home but is out of the solution. Unsure of which solution she has.

## 2024-04-13 NOTE — Discharge Instructions (Addendum)
 Take the prednisone, Zithromax, Tessalon Perles as directed.  A refill of your albuterol nebulizer solution was also sent to the pharmacy.    Follow up with your primary care provider on Monday.  Go to the emergency department if you have worsening symptoms.

## 2024-10-21 ENCOUNTER — Telehealth: Admitting: Nurse Practitioner

## 2024-10-21 DIAGNOSIS — J40 Bronchitis, not specified as acute or chronic: Secondary | ICD-10-CM

## 2024-10-21 MED ORDER — AZITHROMYCIN 250 MG PO TABS: 250.0000 mg | ORAL_TABLET | Freq: Every day | ORAL | 0 refills | Status: AC

## 2024-10-21 MED ORDER — ALBUTEROL SULFATE HFA 108 (90 BASE) MCG/ACT IN AERS: 2.0000 | INHALATION_SPRAY | Freq: Four times a day (QID) | RESPIRATORY_TRACT | 0 refills | Status: AC | PRN

## 2024-10-21 NOTE — Patient Instructions (Signed)
 Nena JINNY Hurst, thank you for joining Haze LELON Servant, NP for today's virtual visit.  While this provider is not your primary care provider (PCP), if your PCP is located in our provider database this encounter information will be shared with them immediately following your visit.   A [facility] MyChart account gives you access to today's visit and all your visits, tests, and labs performed at [facility]  click here if you don't have a  MyChart account or go to [email or web address]  Consent: (Patient) Cheyenne Nash provided verbal consent for this virtual visit at the beginning of the encounter.  Current Medications:  Current Outpatient Medications:    albuterol  (VENTOLIN  HFA) 108 (90 Base) MCG/ACT inhaler, Inhale 2 puffs into the lungs every 6 (six) hours as needed for wheezing or shortness of breath., Disp: 8 g, Rfl: 0   albuterol  (PROVENTIL ) (2.5 MG/3ML) 0.083% nebulizer solution, Take 3 mLs (2.5 mg total) by nebulization every 6 (six) hours as needed for wheezing or shortness of breath., Disp: 75 mL, Rfl: 0   azithromycin  (ZITHROMAX ) 250 MG tablet, Take 1 tablet (250 mg total) by mouth daily. Take first 2 tablets together, then 1 every day until finished., Disp: 6 tablet, Rfl: 0   benzonatate  (TESSALON ) 100 MG capsule, Take 1 capsule (100 mg total) by mouth 3 (three) times daily as needed for cough., Disp: 21 capsule, Rfl: 0   Cholecalciferol (VITAMIN D3) 1.25 MG (50000 UT) TABS, Take by mouth once a week., Disp: , Rfl:    EPINEPHrine 0.3 mg/0.3 mL IJ SOAJ injection, Inject into the muscle., Disp: , Rfl:    estradiol  (ESTRACE ) 1 MG tablet, TAKE 1 TABLET(1 MG) BY MOUTH DAILY, Disp: 90 tablet, Rfl: 0   fluticasone  (FLONASE) 50 MCG/ACT nasal spray, Place 2 sprays into the nose daily. , Disp: , Rfl:    Fluticasone -Salmeterol (ADVAIR) 250-50 MCG/DOSE AEPB, Inhale 1 puff into the lungs daily., Disp: , Rfl:    levothyroxine (SYNTHROID, LEVOTHROID) 75 MCG tablet, Take 75  mcg by mouth daily before breakfast., Disp: , Rfl:    montelukast (SINGULAIR) 10 MG tablet, Take 10 mg by mouth at bedtime., Disp: , Rfl:    omeprazole  (PRILOSEC) 40 MG capsule, Take 40 mg by mouth daily., Disp: , Rfl:    ondansetron (ZOFRAN-ODT) 4 MG disintegrating tablet, , Disp: , Rfl:    Sodium Sulfate-Mag Sulfate-KCl (SUTAB ) [id number] MG TABS, Take 376 mg by mouth as directed. (Patient not taking: Reported on 11/21/2020), Disp: 24 tablet, Rfl: 0   Vitamin D, Ergocalciferol, (DRISDOL) 1.25 MG (50000 UNIT) CAPS capsule, Take 50,000 Units by mouth once a week., Disp: , Rfl:    Medications ordered in this encounter:  Meds ordered this encounter  Medications   azithromycin  (ZITHROMAX ) 250 MG tablet    Sig: Take 1 tablet (250 mg total) by mouth daily. Take first 2 tablets together, then 1 every day until finished.    Dispense:  6 tablet    Refill:  0    Supervising Provider:   LAMPTEY, PHILIP O [id number]   albuterol  (VENTOLIN  HFA) 108 (90 Base) MCG/ACT inhaler    Sig: Inhale 2 puffs into the lungs every 6 (six) hours as needed for wheezing or shortness of breath.    Dispense:  8 g    Refill:  0    Supervising Provider:   BLAISE ALEENE KIDD [[id number]]     *If you need refills on other medications prior to your next appointment, please contact your pharmacy*  Follow-Up: Call back or seek an in-person evaluation if the symptoms worsen or if the condition fails to improve as anticipated.  [facility] [phone]  Other Instructions INSTRUCTIONS: use a humidifier for nasal congestion Drink plenty of fluids, rest and wash hands frequently to avoid the spread of infection Alternate tylenol  and Motrin for relief of fever    If you have been instructed to have an in-person evaluation today at a local Urgent Care facility, please use the link below. It will take you to a list of all of our available [facility], including address, phone number and hours of  operation. Please do not delay care.  [facility]  If you or a family member do not have a primary care provider, use the link below to schedule a visit and establish care. When you choose a [facility] primary care physician or advanced practice provider, you gain a long-term partner in health. Find a Primary Care Provider  Learn more about [facility]'s in-office and virtual care options: [facility] - Get Care Now

## 2024-10-21 NOTE — Progress Notes (Signed)
 Virtual Visit Consent   Cheyenne Nash, you are scheduled for a virtual visit with a [facility] provider today. Just as with appointments in the office, your consent must be obtained to participate. Your consent will be active for this visit and any virtual visit you may have with one of our providers in the next 365 days. If you have a MyChart account, a copy of this consent can be sent to you electronically.  As this is a virtual visit, video technology does not allow for your provider to perform a traditional examination. This may limit your provider's ability to fully assess your condition. If your provider identifies any concerns that need to be evaluated in person or the need to arrange testing (such as labs, EKG, etc.), we will make arrangements to do so. Although advances in technology are sophisticated, we cannot ensure that it will always work on either your end or our end. If the connection with a video visit is poor, the visit may have to be switched to a telephone visit. With either a video or telephone visit, we are not always able to ensure that we have a secure connection.  By engaging in this virtual visit, you consent to the provision of healthcare and authorize for your insurance to be billed (if applicable) for the services provided during this visit. Depending on your insurance coverage, you may receive a charge related to this service.  I need to obtain your verbal consent now. Are you willing to proceed with your visit today? Cheyenne Nash has provided verbal consent on 10/21/2024 for a virtual visit (video or telephone). Cheyenne LELON Servant, NP  Date: 10/21/2024 11:08 AM   Virtual Visit via Video Note   I, Cheyenne Nash, connected with  Cheyenne Nash  ([id number], 11-23-1965) on 10/21/24 at 11:00 AM EST by a video-enabled telemedicine application and verified that I am speaking with the correct person using two identifiers.  Location: Patient: Virtual Visit Location Patient:  Home Provider: Virtual Visit Location Provider: Home Office   I discussed the limitations of evaluation and management by telemedicine and the availability of in person appointments. The patient expressed understanding and agreed to proceed.    History of Present Illness: Cheyenne Nash is a 58 y.o. who identifies as a female who was assigned female at birth, and is being seen today for bronchitis.   [name] has been experiencing persistent cough, nasal congestion and chest congestion. Her husband was recently diagnosed with bronchitis and sinus infection and is currently being treated with antibiotics.     Problems:  Patient Active Problem List   Diagnosis Date Noted   Basal cell carcinoma (BCC) in situ of skin 06/30/2020   PONV (postoperative nausea and vomiting) 06/30/2020   Diarrhea    Polyp of descending colon    Atypical ductal hyperplasia of left breast 09/16/2019   Arthritis of carpometacarpal (CMC) joint of right thumb 08/31/2018   Vitamin D deficiency 06/09/2018   Pain in right hand 11/16/2017   Thyroid disease 02/22/2017   Allergic rhinitis 01/19/2016   Asthma without status asthmaticus 01/19/2016   Degeneration of intervertebral disc of cervical region 01/19/2016   Acid reflux 01/19/2016   Cephalalgia 01/19/2016   Climacteric 01/19/2016   Osteopenia 01/19/2016   Tachycardia 12/19/2015   Asthma, moderate persistent 12/19/2015   Anxiety and depression 09/23/2014   Pure hypercholesterolemia 09/23/2014    Allergies:  Allergies  Allergen Reactions   Sulfa Antibiotics  Hives  Swelling    Medications:  Current Outpatient Medications:    albuterol  (VENTOLIN  HFA) 108 (90 Base) MCG/ACT inhaler, Inhale 2 puffs into the lungs every 6 (six) hours as needed for wheezing or shortness of breath., Disp: 8 g, Rfl: 0   albuterol  (PROVENTIL ) (2.5 MG/3ML) 0.083% nebulizer solution, Take 3 mLs (2.5 mg total) by nebulization every 6 (six) hours as needed for wheezing or  shortness of breath., Disp: 75 mL, Rfl: 0   azithromycin  (ZITHROMAX ) 250 MG tablet, Take 1 tablet (250 mg total) by mouth daily. Take first 2 tablets together, then 1 every day until finished., Disp: 6 tablet, Rfl: 0   benzonatate  (TESSALON ) 100 MG capsule, Take 1 capsule (100 mg total) by mouth 3 (three) times daily as needed for cough., Disp: 21 capsule, Rfl: 0   Cholecalciferol (VITAMIN D3) 1.25 MG (50000 UT) TABS, Take by mouth once a week., Disp: , Rfl:    EPINEPHrine 0.3 mg/0.3 mL IJ SOAJ injection, Inject into the muscle., Disp: , Rfl:    estradiol  (ESTRACE ) 1 MG tablet, TAKE 1 TABLET(1 MG) BY MOUTH DAILY, Disp: 90 tablet, Rfl: 0   fluticasone  (FLONASE) 50 MCG/ACT nasal spray, Place 2 sprays into the nose daily. , Disp: , Rfl:    Fluticasone -Salmeterol (ADVAIR) 250-50 MCG/DOSE AEPB, Inhale 1 puff into the lungs daily., Disp: , Rfl:    levothyroxine (SYNTHROID, LEVOTHROID) 75 MCG tablet, Take 75 mcg by mouth daily before breakfast., Disp: , Rfl:    montelukast (SINGULAIR) 10 MG tablet, Take 10 mg by mouth at bedtime., Disp: , Rfl:    omeprazole  (PRILOSEC) 40 MG capsule, Take 40 mg by mouth daily., Disp: , Rfl:    ondansetron (ZOFRAN-ODT) 4 MG disintegrating tablet, , Disp: , Rfl:    Sodium Sulfate-Mag Sulfate-KCl (SUTAB ) [phone] MG TABS, Take 376 mg by mouth as directed. (Patient not taking: Reported on 11/21/2020), Disp: 24 tablet, Rfl: 0   Vitamin D, Ergocalciferol, (DRISDOL) 1.25 MG (50000 UNIT) CAPS capsule, Take 50,000 Units by mouth once a week., Disp: , Rfl:   Observations/Objective: Patient is well-developed, well-nourished in no acute distress.  Resting comfortably at home.  Head is normocephalic, atraumatic.  No labored breathing.  Speech is clear and coherent with logical content.  Patient is alert and oriented at baseline.    Assessment and Plan: 1. Bronchitis (Primary) - azithromycin  (ZITHROMAX ) 250 MG tablet; Take 1 tablet (250 mg total) by mouth daily. Take first 2  tablets together, then 1 every day until finished.  Dispense: 6 tablet; Refill: 0 - albuterol  (VENTOLIN  HFA) 108 (90 Base) MCG/ACT inhaler; Inhale 2 puffs into the lungs every 6 (six) hours as needed for wheezing or shortness of breath.  Dispense: 8 g; Refill: 0  INSTRUCTIONS: use a humidifier for nasal congestion Drink plenty of fluids, rest and wash hands frequently to avoid the spread of infection Alternate tylenol  and Motrin for relief of fever   Follow Up Instructions: I discussed the assessment and treatment plan with the patient. The patient was provided an opportunity to ask questions and all were answered. The patient agreed with the plan and demonstrated an understanding of the instructions.  A copy of instructions were sent to the patient via MyChart unless otherwise noted below.     The patient was advised to call back or seek an in-person evaluation if the symptoms worsen or if the condition fails to improve as anticipated.    Namon Villarin W Javae Braaten, NP
